# Patient Record
Sex: Male | Born: 1965 | Race: White | Hispanic: No | Marital: Married | Smoking: Never smoker
Health system: Southern US, Community
[De-identification: ages and names within clinical notes are randomized; demographics above are authoritative.]

## PROBLEM LIST (undated history)

## (undated) DIAGNOSIS — K648 Other hemorrhoids: Secondary | ICD-10-CM

## (undated) DIAGNOSIS — K824 Cholesterolosis of gallbladder: Secondary | ICD-10-CM

## (undated) DIAGNOSIS — K219 Gastro-esophageal reflux disease without esophagitis: Secondary | ICD-10-CM

## (undated) DIAGNOSIS — R030 Elevated blood-pressure reading, without diagnosis of hypertension: Secondary | ICD-10-CM

## (undated) DIAGNOSIS — B880 Other acariasis: Secondary | ICD-10-CM

## (undated) DIAGNOSIS — I499 Cardiac arrhythmia, unspecified: Secondary | ICD-10-CM

## (undated) DIAGNOSIS — F419 Anxiety disorder, unspecified: Secondary | ICD-10-CM

## (undated) DIAGNOSIS — I861 Scrotal varices: Secondary | ICD-10-CM

## (undated) DIAGNOSIS — N433 Hydrocele, unspecified: Secondary | ICD-10-CM

## (undated) DIAGNOSIS — Z8489 Family history of other specified conditions: Secondary | ICD-10-CM

## (undated) DIAGNOSIS — I6529 Occlusion and stenosis of unspecified carotid artery: Secondary | ICD-10-CM

## (undated) DIAGNOSIS — E7800 Pure hypercholesterolemia, unspecified: Secondary | ICD-10-CM

## (undated) HISTORY — DX: Gastro-esophageal reflux disease without esophagitis: K21.9

## (undated) HISTORY — DX: Pure hypercholesterolemia, unspecified: E78.00

## (undated) HISTORY — PX: COLONOSCOPY: SHX174

## (undated) HISTORY — PX: EXPLORATORY LAPAROTOMY: SUR591

---

## 2007-09-07 ENCOUNTER — Ambulatory Visit (HOSPITAL_COMMUNITY): Admission: RE | Admit: 2007-09-07 | Discharge: 2007-09-07 | Payer: Self-pay | Admitting: Family Medicine

## 2007-11-02 ENCOUNTER — Encounter (INDEPENDENT_AMBULATORY_CARE_PROVIDER_SITE_OTHER): Payer: Self-pay | Admitting: General Surgery

## 2007-11-02 ENCOUNTER — Ambulatory Visit (HOSPITAL_COMMUNITY): Admission: RE | Admit: 2007-11-02 | Discharge: 2007-11-02 | Payer: Self-pay | Admitting: General Surgery

## 2010-09-26 ENCOUNTER — Ambulatory Visit: Payer: Self-pay | Admitting: Cardiology

## 2011-05-06 NOTE — H&P (Signed)
[name], [name]                 ACCOUNT NO.:  [id number]   MEDICAL RECORD NO.:  [id number]          PATIENT TYPE:  AMB   LOCATION:  DAY                           FACILITY:  [facility]   PHYSICIAN:  Dalia Heading, M.D.  DATE OF BIRTH:  1966-11-11   DATE OF ADMISSION:  DATE OF DISCHARGE:  LH                              HISTORY & PHYSICAL   CHIEF COMPLAINT:  Family history of colon carcinoma.   HISTORY OF PRESENT ILLNESS:  The patient is a 45 year old white male who  is referred for endoscopic evaluation.  He needs a colonoscopy for a  family history of colon carcinoma in his mother.  His mother was  diagnosed in her 25s with colon carcinoma.  No abdominal pain, weight  loss, nausea, vomiting, diarrhea, constipation, melena, or hematochezia  have been noted.  He has never had a colonoscopy.   PAST MEDICAL HISTORY:  Unremarkable.   PAST SURGICAL HISTORY:  Unremarkable.   CURRENT MEDICATIONS:  None.   ALLERGIES:  No known drug allergies.   REVIEW OF SYSTEMS:  Noncontributory.   PHYSICAL EXAMINATION:  GENERAL:  The patient is a well-developed, well-  nourished, white male in no acute distress.  LUNGS:  Clear to auscultation with equal breath sounds bilaterally.  HEART:  Reveals a regular rate and rhythm without S3, S4, or murmurs.  ABDOMEN:  Soft, nontender, nondistended.  No hepatosplenomegaly or  masses are noted.  RECTAL:  Deferred to the procedure.   IMPRESSION:  Family history of colon carcinoma.   PLAN:  The patient is scheduled for a colonoscopy on November 02, 2007.  The risks and benefits of the procedure including bleeding and  perforation were fully explained to the patient, gave informed consent.      Dalia Heading, M.D.  Electronically Signed     MAJ/MEDQ  D:  10/04/2007  T:  10/04/2007  Job:  [id number]   cc:   [facility]   [facility]  Fax: [phone]

## 2012-08-20 ENCOUNTER — Other Ambulatory Visit (HOSPITAL_COMMUNITY): Payer: Self-pay | Admitting: Family Medicine

## 2012-08-20 ENCOUNTER — Ambulatory Visit (HOSPITAL_COMMUNITY)
Admission: RE | Admit: 2012-08-20 | Discharge: 2012-08-20 | Disposition: A | Discharge status: Home / Self Care | Payer: BC Managed Care – PPO | Source: Ambulatory Visit | Attending: Family Medicine | Admitting: Family Medicine

## 2012-08-20 DIAGNOSIS — R079 Chest pain, unspecified: Secondary | ICD-10-CM | POA: Insufficient documentation

## 2012-09-01 HISTORY — PX: NM MYOVIEW LTD: HXRAD82

## 2012-12-23 ENCOUNTER — Other Ambulatory Visit (HOSPITAL_COMMUNITY): Payer: Self-pay | Admitting: Cardiovascular Disease

## 2012-12-23 DIAGNOSIS — R002 Palpitations: Secondary | ICD-10-CM

## 2012-12-27 ENCOUNTER — Encounter (INDEPENDENT_AMBULATORY_CARE_PROVIDER_SITE_OTHER): Payer: Self-pay | Admitting: *Deleted

## 2012-12-30 ENCOUNTER — Ambulatory Visit (HOSPITAL_COMMUNITY)
Admission: RE | Admit: 2012-12-30 | Discharge: 2012-12-30 | Disposition: A | Discharge status: Home / Self Care | Payer: BC Managed Care – PPO | Source: Ambulatory Visit | Attending: Cardiovascular Disease | Admitting: Cardiovascular Disease

## 2012-12-30 DIAGNOSIS — I059 Rheumatic mitral valve disease, unspecified: Secondary | ICD-10-CM | POA: Insufficient documentation

## 2012-12-30 DIAGNOSIS — R002 Palpitations: Secondary | ICD-10-CM | POA: Insufficient documentation

## 2012-12-30 DIAGNOSIS — I369 Nonrheumatic tricuspid valve disorder, unspecified: Secondary | ICD-10-CM | POA: Insufficient documentation

## 2012-12-30 NOTE — Progress Notes (Signed)
Alvin Howard   2D echo completed 12/30/2012.   Cindy Derrel Moore, RDCS   

## 2013-01-05 ENCOUNTER — Telehealth (INDEPENDENT_AMBULATORY_CARE_PROVIDER_SITE_OTHER): Payer: Self-pay | Admitting: *Deleted

## 2013-01-05 ENCOUNTER — Encounter (INDEPENDENT_AMBULATORY_CARE_PROVIDER_SITE_OTHER): Payer: Self-pay | Admitting: Internal Medicine

## 2013-01-05 ENCOUNTER — Other Ambulatory Visit (INDEPENDENT_AMBULATORY_CARE_PROVIDER_SITE_OTHER): Payer: Self-pay | Admitting: *Deleted

## 2013-01-05 ENCOUNTER — Ambulatory Visit (INDEPENDENT_AMBULATORY_CARE_PROVIDER_SITE_OTHER): Payer: BC Managed Care – PPO | Admitting: Internal Medicine

## 2013-01-05 VITALS — BP 118/62 | HR 72 | Temp 98.1°F | Ht 67.0 in | Wt 204.0 lb

## 2013-01-05 DIAGNOSIS — K219 Gastro-esophageal reflux disease without esophagitis: Secondary | ICD-10-CM | POA: Insufficient documentation

## 2013-01-05 DIAGNOSIS — Z800 Family history of malignant neoplasm of digestive organs: Secondary | ICD-10-CM | POA: Insufficient documentation

## 2013-01-05 DIAGNOSIS — Z1211 Encounter for screening for malignant neoplasm of colon: Secondary | ICD-10-CM

## 2013-01-05 DIAGNOSIS — E7800 Pure hypercholesterolemia, unspecified: Secondary | ICD-10-CM | POA: Insufficient documentation

## 2013-01-05 MED ORDER — PEG-KCL-NACL-NASULF-NA ASC-C 100 G PO SOLR: 1.0000 | Freq: Once | ORAL | Status: DC

## 2013-01-05 NOTE — Patient Instructions (Addendum)
EGD/Colonoscopy

## 2013-01-05 NOTE — Progress Notes (Signed)
Subjective:     Patient ID: Alvin Howard, male   DOB: 1966-09-04, 47 y.o.   MRN: [id number]  HPI  Referred to our office by Dr. Phillips Odor for heart burn/gas issues. Put on Prilosec and did not help. Symptoms started in September.  Chest xray, and EKG were normal in his office. He was then put on Nexium and sent to see Dr Tresa Endo . He had some anxiety, He had a nuclear [place] and echo were normal. Berkley test was abnormal.  He tells me he has frequent acid reflux. Sausage really upsets him. Appetite is good. Weight loss which was intentional due to his cholesterol. He avoids acidic fluids. Sometimes he has a funny feeling in his epigastric region  Presently has a Holter monitor on today.    12/16/2012 ALP 59, AST 26, ALT 39, Total protein 6.8, Albumin 4.6, H and H 15.1 and 43.5, MCV 81.0 TSH 0.824  11/02/2007 Colonoscopy: Dr. Lovell Sheehan There is adenomatous epithelium having a predominantly tubular growth pattern consistent with a tubular adenoma if the biopsy is representative of the entire lesion. No high grade dysplasia or evidence of malignancy is identified. Clinical correlation is recommended. (HCL:cdc 11/03/07)     Family hx of colon cancer: Mother in her late 49. Patient last colonoscopy was 6 yrs ago. Needs surveillance Review of Systems     Objective:   Physical Exam  Filed Vitals:   01/05/13 1053  BP: 118/62  Pulse: 72  Temp: 98.1 F (36.7 C)  Height: 5\' 7"  (1.702 m)  Weight: 204 lb (92.534 kg)  Alert and oriented. Skin warm and dry. Oral mucosa is moist.   . Sclera anicteric, conjunctivae is pink. Thyroid not enlarged. No cervical lymphadenopathy. Lungs clear. Heart regular rate and rhythm.  Abdomen is soft. Bowel sounds are positive. No hepatomegaly. No abdominal masses felt. No tenderness.  No edema to lower extremities. Patient is alert and oriented.       Assessment:    Possible GERD uncontrolled at this time.PUD needs to be ruled out.  Presently, cardiac work up  which has been negative so far. orgin being ruled out.   Family hx of colon cancer. Plan:     EGD/Colonoscopy with Dr. Karilyn Cota.

## 2013-01-05 NOTE — Telephone Encounter (Signed)
Patient needs movi prep 

## 2013-01-11 ENCOUNTER — Encounter (HOSPITAL_COMMUNITY): Payer: Self-pay | Admitting: Pharmacy Technician

## 2013-01-13 ENCOUNTER — Encounter (INDEPENDENT_AMBULATORY_CARE_PROVIDER_SITE_OTHER): Payer: Self-pay

## 2013-01-20 ENCOUNTER — Ambulatory Visit (HOSPITAL_COMMUNITY)
Admission: RE | Admit: 2013-01-20 | Discharge: 2013-01-20 | Disposition: A | Discharge status: Home / Self Care | Payer: BC Managed Care – PPO | Source: Ambulatory Visit | Attending: Internal Medicine | Admitting: Internal Medicine

## 2013-01-20 ENCOUNTER — Encounter (HOSPITAL_COMMUNITY)
Admission: RE | Disposition: A | Discharge status: Home / Self Care | Payer: Self-pay | Source: Ambulatory Visit | Attending: Internal Medicine

## 2013-01-20 ENCOUNTER — Encounter (HOSPITAL_COMMUNITY): Payer: Self-pay | Admitting: *Deleted

## 2013-01-20 ENCOUNTER — Other Ambulatory Visit (INDEPENDENT_AMBULATORY_CARE_PROVIDER_SITE_OTHER): Payer: Self-pay | Admitting: Internal Medicine

## 2013-01-20 DIAGNOSIS — K644 Residual hemorrhoidal skin tags: Secondary | ICD-10-CM | POA: Insufficient documentation

## 2013-01-20 DIAGNOSIS — Z860100 Personal history of colon polyps, unspecified: Secondary | ICD-10-CM | POA: Insufficient documentation

## 2013-01-20 DIAGNOSIS — R12 Heartburn: Secondary | ICD-10-CM

## 2013-01-20 DIAGNOSIS — K219 Gastro-esophageal reflux disease without esophagitis: Secondary | ICD-10-CM

## 2013-01-20 DIAGNOSIS — Z800 Family history of malignant neoplasm of digestive organs: Secondary | ICD-10-CM

## 2013-01-20 HISTORY — PX: COLONOSCOPY WITH ESOPHAGOGASTRODUODENOSCOPY (EGD): SHX5779

## 2013-01-20 SURGERY — COLONOSCOPY WITH ESOPHAGOGASTRODUODENOSCOPY (EGD)
Anesthesia: Moderate Sedation

## 2013-01-20 MED ORDER — MIDAZOLAM HCL 5 MG/5ML IJ SOLN
INTRAMUSCULAR | Status: AC
  Filled 2013-01-20: qty 10

## 2013-01-20 MED ORDER — MEPERIDINE HCL 50 MG/ML IJ SOLN
INTRAMUSCULAR | Status: DC | PRN
  Administered 2013-01-20 (×2): 25 mg via INTRAVENOUS

## 2013-01-20 MED ORDER — MEPERIDINE HCL 50 MG/ML IJ SOLN
INTRAMUSCULAR | Status: AC
  Filled 2013-01-20: qty 1

## 2013-01-20 MED ORDER — SODIUM CHLORIDE 0.45 % IV SOLN
INTRAVENOUS | Status: DC
  Administered 2013-01-20: 12:00:00 via INTRAVENOUS

## 2013-01-20 MED ORDER — MIDAZOLAM HCL 5 MG/5ML IJ SOLN
INTRAMUSCULAR | Status: DC | PRN
  Administered 2013-01-20 (×6): 2 mg via INTRAVENOUS

## 2013-01-20 MED ORDER — STERILE WATER FOR IRRIGATION IR SOLN
Status: DC | PRN
  Administered 2013-01-20: 13:00:00

## 2013-01-20 MED ORDER — MIDAZOLAM HCL 5 MG/5ML IJ SOLN
INTRAMUSCULAR | Status: AC
  Filled 2013-01-20: qty 5

## 2013-01-20 NOTE — H&P (Signed)
Alvin Howard is an 47 y.o. male.   Chief Complaint: Patient is here for EGD and colonoscopy. HPI: Patient is 46 year old Caucasian male who presents with 5 month history of heartburn and regurgitation and burning at epigastrium and has not responded to Prilosec and Nexium. He is watching his diet and doing better with Dexlansoprazole. result and appears to be doing better. He is on very strict diet. He denies dysphagia nausea vomiting melena or rectal bleeding. He has history of colonic adenoma and family history of colon carcinoma in his mother who was in her 70s at the time of diagnosis.  Past Medical History  Diagnosis Date  . High cholesterol   . GERD (gastroesophageal reflux disease)     Past Surgical History  Procedure Date  . Colonoscopy   . Exploratory laparotomy     as a child    Family History  Problem Relation Age of Onset  . Colon cancer Mother    Social History:  reports that he has never smoked. He does not have any smokeless tobacco history on file. He reports that he drinks alcohol. He reports that he does not use illicit drugs.  Allergies: No Known Allergies  Medications Prior to Admission  Medication Sig Dispense Refill  . alum & mag hydroxide-simeth (MAALOX/MYLANTA) 200-200-20 MG/5ML suspension Take 15 mLs by mouth every 6 (six) hours as needed. Upset Stomach      . LORazepam (ATIVAN) 0.5 MG tablet Take 0.5 mg by mouth 3 (three) times daily as needed. Anxiety      . peg 3350 powder (MOVIPREP) 100 G SOLR Take 1 kit (100 g total) by mouth once.  1 kit  0  . aspirin 81 MG tablet Take 81 mg by mouth daily.      Marland Kitchen co-enzyme Q-10 30 MG capsule Take 30 mg by mouth daily.      Marland Kitchen dexlansoprazole (DEXILANT) 60 MG capsule Take 60 mg by mouth daily.      . fish oil-omega-3 fatty acids 1000 MG capsule Take 2 g by mouth daily.      . Garlic 10 MG CAPS Take 1 capsule by mouth daily.       . Multiple Vitamin (MULTIVITAMIN) tablet Take 1 tablet by mouth daily.      . Red Yeast  Rice Extract (RED YEAST RICE PO) Take 2 tablets by mouth.      . simvastatin (ZOCOR) 20 MG tablet Take 20 mg by mouth daily. Takes every 3rd day.        No results found for this or any previous visit (from the past 48 hour(s)). No results found.  ROS  Blood pressure 132/84, pulse 73, temperature 98 F (36.7 C), temperature source Oral, resp. rate 22, SpO2 98.00%. Physical Exam  Constitutional: He appears well-developed and well-nourished.  HENT:  Mouth/Throat: Oropharynx is clear and moist.  Eyes: Conjunctivae normal are normal. No scleral icterus.  Neck: No thyromegaly present.  Cardiovascular: Normal rate, regular rhythm and normal heart sounds.   No murmur heard. Respiratory: Effort normal and breath sounds normal.  GI: Soft. He exhibits no distension and no mass. There is no tenderness.  Musculoskeletal: He exhibits no edema.  Lymphadenopathy:    He has no cervical adenopathy.  Neurological: He is alert.  Skin: Skin is warm and dry.     Assessment/Plan Refractory GERD of recent onset. History of colonic adenoma and family history of colon carcinoma in first degree. Diagnostic EGD and surveillance colonoscopy.  Alvin Howard U 01/20/2013,  1:09 PM

## 2013-01-20 NOTE — Op Note (Signed)
EGD PROCEDURE REPORT  PATIENT:  Alvin Howard  MR#:  [id number] Birthdate:  12-23-1965, 47 y.o., male Endoscopist:  Dr. Malissa Hippo, MD Referred By:  Dr. Colette Ribas, MD Procedure Date: 01/20/2013  Procedure:   EGD & Colonoscopy  Indications:  Patient is 47 year old Caucasian male with recent onset of symptoms of GERD not responding to two PPIs but he is doing better with dexlansoprazole. He is undergoing diagnostic EGD followed by surveillance colonoscopy. He has small adenoma removed on his last colonoscopy over 5 years ago and family history significant for colon carcinoma in his mother(in her 79s at the time of diagnosis).           Informed Consent:  The risks, benefits, alternatives & imponderables which include, but are not limited to, bleeding, infection, perforation, drug reaction and potential missed lesion have been reviewed.  The potential for biopsy, lesion removal, esophageal dilation, etc. have also been discussed.  Questions have been answered.  All parties agreeable.  Please see history & physical in medical record for more information.  Medications:  Demerol 50 mg IV Versed 12 mg IV Cetacaine spray topically for oropharyngeal anesthesia  EGD  Description of procedure:  The endoscope was introduced through the mouth and advanced to the second portion of the duodenum without difficulty or limitations. The mucosal surfaces were surveyed very carefully during advancement of the scope and upon withdrawal.  Findings:  Esophagus:  Mucosa of the esophagus was normal. GE junction was unremarkable. GEJ:  39 cm Stomach:  Stomach was empty and distended very well with insufflation. Folds in the proximal stomach were normal. Examination of mucosa at body, antrum, pyloric channel, angularis fundus and cardia was normal. Duodenum:  Normal bulbar and post bulbar mucosa.  Therapeutic/Diagnostic Maneuvers Performed:  None  COLONOSCOPY Description of procedure:  After a  digital rectal exam was performed, that colonoscope was advanced from the anus through the rectum and colon to the area of the cecum, ileocecal valve and appendiceal orifice. The cecum was deeply intubated. These structures were well-seen and photographed for the record. From the level of the cecum and ileocecal valve, the scope was slowly and cautiously withdrawn. The mucosal surfaces were carefully surveyed utilizing scope tip to flexion to facilitate fold flattening as needed. The scope was pulled down into the rectum where a thorough exam including retroflexion was performed.  Findings:   Prep excellent. Normal mucosa of area segments of colon and rectum. Small hemorrhoids noted below the dentate line.  Therapeutic/Diagnostic Maneuvers Performed:  None  Complications:  None  Cecal Withdrawal Time:  10 minutes  Impression:  Normal esophagogastroduodenoscopy. Small external hemorrhoids otherwise normal colonoscopy.  Recommendations:  Patient will continue anti-reflux measures and  dexlansoprazole at current dose. Will schedule upper abdominal ultrasound to rule out cholelithiasis. Next colonoscopy would be in 5 years.  Landis Cassaro U  01/20/2013 2:02 PM  CC: Dr. Phillips Odor, Chancy Hurter, MD & Dr. Bonnetta Barry ref. provider found

## 2013-01-21 ENCOUNTER — Encounter (HOSPITAL_COMMUNITY): Payer: Self-pay | Admitting: Internal Medicine

## 2013-01-25 ENCOUNTER — Ambulatory Visit (HOSPITAL_COMMUNITY)
Admission: RE | Admit: 2013-01-25 | Discharge: 2013-01-25 | Disposition: A | Discharge status: Home / Self Care | Payer: BC Managed Care – PPO | Source: Ambulatory Visit | Attending: Internal Medicine | Admitting: Internal Medicine

## 2013-01-25 DIAGNOSIS — R12 Heartburn: Secondary | ICD-10-CM

## 2013-01-25 DIAGNOSIS — K824 Cholesterolosis of gallbladder: Secondary | ICD-10-CM | POA: Insufficient documentation

## 2013-01-25 DIAGNOSIS — K219 Gastro-esophageal reflux disease without esophagitis: Secondary | ICD-10-CM

## 2013-01-25 HISTORY — DX: Cholesterolosis of gallbladder: K82.4

## 2013-02-04 ENCOUNTER — Telehealth (INDEPENDENT_AMBULATORY_CARE_PROVIDER_SITE_OTHER): Payer: Self-pay | Admitting: General Surgery

## 2013-02-04 NOTE — Telephone Encounter (Signed)
Called and left message with patient's wife Alvin Howard) to advise there was a cancellation in the schedule for a 9:00 appointment. Advised if the patient was willing to be seen sooner to arrive at 8:30 for a 9:00 appointment. I advised I would keep the appointment at 11:45 in the system as it is and that if the patient can confirm he can come earlier that the appointment can be changed at that time.   I advised Alvin Howard that our office opens at 8:00, phones at 8:30, and if they want to come directly to our office that is fine. She stated that he does have an appointment with his regular doctor at 8:15 so she is not sure if he is willing to skip that appointment to come see [place] earlier.

## 2013-02-05 ENCOUNTER — Other Ambulatory Visit: Payer: Self-pay

## 2013-02-07 ENCOUNTER — Encounter (INDEPENDENT_AMBULATORY_CARE_PROVIDER_SITE_OTHER): Payer: Self-pay | Admitting: General Surgery

## 2013-02-07 ENCOUNTER — Ambulatory Visit (INDEPENDENT_AMBULATORY_CARE_PROVIDER_SITE_OTHER): Payer: BC Managed Care – PPO | Admitting: General Surgery

## 2013-02-07 VITALS — BP 132/86 | HR 68 | Temp 97.8°F | Resp 16 | Ht 67.0 in | Wt 195.0 lb

## 2013-02-07 DIAGNOSIS — K824 Cholesterolosis of gallbladder: Secondary | ICD-10-CM

## 2013-02-07 NOTE — Patient Instructions (Signed)
Your symptoms are consistent with recurrent gallbladder attacks. Your ultrasound shows multiple filling defects in the gallbladder consistent with polyps or possibly stones.  Considering the extensive GI and cardiac workup that you have had, I recommended that the next step in your treatment is cholecystectomy. Your symptoms will probably improve or resolve, although we cannot guarantee that with certainty.  I recommend that you go ahead and start the beta blockers that have been prescribed for you.  Please stop your aspirin 3 days prior to surgery.       Laparoscopic Cholecystectomy Laparoscopic cholecystectomy is surgery to remove the gallbladder. The gallbladder is located slightly to the right of center in the abdomen, behind the liver. It is a concentrating and storage sac for the bile produced in the liver. Bile aids in the digestion and absorption of fats. Gallbladder disease (cholecystitis) is an inflammation of your gallbladder. This condition is usually caused by a buildup of gallstones (cholelithiasis) in your gallbladder. Gallstones can block the flow of bile, resulting in inflammation and pain. In severe cases, emergency surgery may be required. When emergency surgery is not required, you will have time to prepare for the procedure. Laparoscopic surgery is an alternative to open surgery. Laparoscopic surgery usually has a shorter recovery time. Your common bile duct may also need to be examined and explored. Your caregiver will discuss this with you if he or she feels this should be done. If stones are found in the common bile duct, they may be removed. LET YOUR CAREGIVER KNOW ABOUT:  Allergies to food or medicine.  Medicines taken, including vitamins, herbs, eyedrops, over-the-counter medicines, and creams.  Use of steroids (by mouth or creams).  Previous problems with anesthetics or numbing medicines.  History of bleeding problems or blood clots.  Previous surgery.  Other  health problems, including diabetes and kidney problems.  Possibility of pregnancy, if this applies. RISKS AND COMPLICATIONS All surgery is associated with risks. Some problems that may occur following this procedure include:  Infection.  Damage to the common bile duct, nerves, arteries, veins, or other internal organs such as the stomach or intestines.  Bleeding.  A stone may remain in the common bile duct. BEFORE THE PROCEDURE  Do not take aspirin for 3 days prior to surgery or blood thinners for 1 week prior to surgery.  Do not eat or drink anything after midnight the night before surgery.  Let your caregiver know if you develop a cold or other infectious problem prior to surgery.  You should be present 60 minutes before the procedure or as directed. PROCEDURE  You will be given medicine that makes you sleep (general anesthetic). When you are asleep, your surgeon will make several small cuts (incisions) in your abdomen. One of these incisions is used to insert a small, lighted scope (laparoscope) into the abdomen. The laparoscope helps the surgeon see into your abdomen. Carbon dioxide gas will be pumped into your abdomen. The gas allows more room for the surgeon to perform your surgery. Other operating instruments are inserted through the other incisions. Laparoscopic procedures may not be appropriate when:  There is major scarring from previous surgery.  The gallbladder is extremely inflamed.  There are bleeding disorders or unexpected cirrhosis of the liver.  A pregnancy is near term.  Other conditions make the laparoscopic procedure impossible. If your surgeon feels it is not safe to continue with a laparoscopic procedure, he or she will perform an open abdominal procedure. In this case, the surgeon will  make an incision to open the abdomen. This gives the surgeon a larger view and field to work within. This may allow the surgeon to perform procedures that sometimes cannot be  performed with a laparoscope alone. Open surgery has a longer recovery time. AFTER THE PROCEDURE  You will be taken to the recovery area where a nurse will watch and check your progress.  You may be allowed to go home the same day.  Do not resume physical activities until directed by your caregiver.  You may resume a normal diet and activities as directed. Document Released: 12/08/2005 Document Revised: 03/01/2012 Document Reviewed: 05/23/2011 [facility] Patient Information 2013 [place], [place].

## 2013-02-07 NOTE — Progress Notes (Signed)
Patient ID: Alvin Howard, male   DOB: 02-12-1966, 47 y.o.   MRN: [id number]  Chief Complaint  Patient presents with  . Other    new pt- eval gb polyps    HPI Alvin Howard is a 47 y.o. male.  He is referred by Dr. Karilyn Cota and Dr. Assunta Found for consideration of cholecystectomy.  This patient has had symptoms since August of 2013. He has had non-exertional epigastric and substernal chest pressure. This is often, but not always after a heavy meal. He denies nausea or vomiting. This was thought to be reflux and he was placed on proton pump inhibitors. He had an extensive workup by Dr. Norwood Levo including echocardiogram, stress test and Holter monitor and was told that there was nothing abnormal except for occasional tachycardia for which he has been prescribed beta blockers. His last significant episode of pain was over Christmas when he ate at the [facility]. . Since that time he has had some minor episodes but nothing bad. He has a stringently avoid any fatty foods, salad dressings, red meats and has just been eating chicken and fish. He has lost about 14 pounds. Postprandial symptoms seem to be the most common pattern.  Upper endoscopy and colonoscopy were normal on 01/05/2013. Liver function tests and white count were normal on 12/16/2012. H. Pylori negative.  Ultrasound shows an abnormal gallbladder with numerous polyps but no stones that moved. Prominent wall but not grossly thickened. CBD not dilated.  Past history is significant in significant for what looks like a laparoscopy at age 51 following blunt trauma. Performed and eaten. No intra-abdominal injuries occurred to the patient.  Small puncture wound the umbilicus is the only scar. Colonoscopy 2000 Richard ascending colon adenomas. He has hyperlipidemia.  Family history significant for colon cancer in the mother, cardiac disease, lung cancer, and gallbladder disease in the father. Mother also had ovarian cancer.  The patient is  asymptomatic today and  is here with his wife and daughter. HPI  Past Medical History  Diagnosis Date  . High cholesterol   . GERD (gastroesophageal reflux disease)     Past Surgical History  Procedure Laterality Date  . Colonoscopy    . Exploratory laparotomy      as a child  . Colonoscopy with esophagogastroduodenoscopy (egd)  01/20/2013    Procedure: COLONOSCOPY WITH ESOPHAGOGASTRODUODENOSCOPY (EGD);  Surgeon: Malissa Hippo, MD;  Location: [facility];  Service: Endoscopy;  Laterality: N/A;  100    Family History  Problem Relation Age of Onset  . Colon cancer Mother   . Cancer Mother     colon, ovarian  . Cancer Father     lung    Social History History  Substance Use Topics  . Smoking status: Never Smoker   . Smokeless tobacco: Not on file  . Alcohol Use: Yes     Comment: occasionally red wine    No Known Allergies  Current Outpatient Prescriptions  Medication Sig Dispense Refill  . alum & mag hydroxide-simeth (MAALOX/MYLANTA) 200-200-20 MG/5ML suspension Take 15 mLs by mouth every 6 (six) hours as needed. Upset Stomach      . aspirin 81 MG tablet Take 81 mg by mouth daily.      Marland Kitchen co-enzyme Q-10 30 MG capsule Take 30 mg by mouth daily.      Marland Kitchen dexlansoprazole (DEXILANT) 60 MG capsule Take 60 mg by mouth daily.      . fish oil-omega-3 fatty acids 1000 MG capsule Take  2 g by mouth daily.      . Garlic 10 MG CAPS Take 1 capsule by mouth daily.       Marland Kitchen LORazepam (ATIVAN) 0.5 MG tablet Take 0.5 mg by mouth 3 (three) times daily as needed. Anxiety      . Multiple Vitamin (MULTIVITAMIN) tablet Take 1 tablet by mouth daily.      . Red Yeast Rice Extract (RED YEAST RICE PO) Take 2 tablets by mouth.      . simvastatin (ZOCOR) 20 MG tablet Take 20 mg by mouth daily. Takes every 3rd day.       No current facility-administered medications for this visit.    Review of Systems Review of Systems  Constitutional: Negative for fever, chills and unexpected weight change.   HENT: Negative for hearing loss, congestion, sore throat, trouble swallowing and voice change.   Eyes: Negative for visual disturbance.  Respiratory: Negative for cough and wheezing.   Cardiovascular: Negative for chest pain, palpitations and leg swelling.  Gastrointestinal: Positive for abdominal pain. Negative for nausea, vomiting, diarrhea, constipation, blood in stool, abdominal distention, anal bleeding and rectal pain.  Genitourinary: Negative for hematuria and difficulty urinating.  Musculoskeletal: Negative for arthralgias.  Skin: Negative for rash and wound.  Neurological: Negative for seizures, syncope, weakness and headaches.  Hematological: Negative for adenopathy. Does not bruise/bleed easily.  Psychiatric/Behavioral: Negative for confusion. The patient is nervous/anxious.     Blood pressure 132/86, pulse 68, temperature 97.8 F (36.6 C), temperature source Temporal, resp. rate 16, height 5\' 7"  (1.702 m), weight 195 lb (88.451 kg).  Physical Exam Physical Exam  Constitutional: He is oriented to person, place, and time. He appears well-developed and well-nourished. No distress.  HENT:  Head: Normocephalic.  Nose: Nose normal.  Mouth/Throat: No oropharyngeal exudate.  Eyes: Conjunctivae and EOM are normal. Pupils are equal, round, and reactive to light. Right eye exhibits no discharge. Left eye exhibits no discharge. No scleral icterus.  Neck: Normal range of motion. Neck supple. No JVD present. No tracheal deviation present. No thyromegaly present.  Cardiovascular: Normal rate, regular rhythm, normal heart sounds and intact distal pulses.   No murmur heard. Pulmonary/Chest: Effort normal and breath sounds normal. No stridor. No respiratory distress. He has no wheezes. He has no rales. He exhibits no tenderness.  Abdominal: Soft. Bowel sounds are normal. He exhibits no distension and no mass. There is no tenderness. There is no rebound and no guarding.  Small puncture wound  in the midline just below the umbilicus, looks like a laparoscopy scar. No mass. No organomegaly. No hernia.  Musculoskeletal: Normal range of motion. He exhibits no edema and no tenderness.  Lymphadenopathy:    He has no cervical adenopathy.  Neurological: He is alert and oriented to person, place, and time. He has normal reflexes. Coordination normal.  Skin: Skin is warm and dry. No rash noted. He is not diaphoretic. No erythema. No pallor.  Psychiatric: He has a normal mood and affect. His behavior is normal. Judgment and thought content normal.    Data Reviewed Lab work, office notes, endoscopy and colonoscopy report, cardiac workup.  Assessment    Gallbladder polyps, probable chronic cholecystitis and recurrent biliary colic.  The probability of his symptoms resolving following cholecystectomy is relatively high, possibly in the 75-85% range given the constellation of symptoms, findings, family history ,and workup to date.   I have discussed these issues with him in detail and have offered him cholecystectomy, and that it that is  what he would like to do.  Hyperlipidemia  Superimposed anxiety, this has improved since his cardiac workup and GI workup were otherwise negative.  History exploratory laparoscopy H. Kim following blunt trauma Probable superimposed GERD, although no esophagitis on recent endoscopy.     Plan    Proceed with scheduling of laparoscopic cholecystectomy with cholangiogram, possible open.  Discontinue aspirin 2-3 days preop  I told him to stay on his beta blocker   I discussed the indications, details, techniques, and numerous risks of the surgery with the patient and his wife. I reviewed patient information booklets with diagrams. They understand all these issues. All their questions are answered. They agree with this plan.       Alvin Howard. Derrell Lolling, M.D., [facility] General and Minimally invasive Surgery Breast and  Colorectal Surgery Office:   [phone] Pager:   [phone]  02/07/2013, 12:40 PM

## 2013-02-10 ENCOUNTER — Encounter (HOSPITAL_COMMUNITY)
Admission: RE | Admit: 2013-02-10 | Discharge: 2013-02-10 | Disposition: A | Discharge status: Home / Self Care | Payer: BC Managed Care – PPO | Source: Ambulatory Visit | Attending: General Surgery | Admitting: General Surgery

## 2013-02-10 ENCOUNTER — Encounter (HOSPITAL_COMMUNITY): Payer: Self-pay

## 2013-02-10 ENCOUNTER — Encounter (HOSPITAL_COMMUNITY): Payer: Self-pay | Admitting: Pharmacy Technician

## 2013-02-10 HISTORY — DX: Family history of other specified conditions: Z84.89

## 2013-02-10 HISTORY — DX: Cardiac arrhythmia, unspecified: I49.9

## 2013-02-10 HISTORY — DX: Other hemorrhoids: K64.8

## 2013-02-10 HISTORY — DX: Anxiety disorder, unspecified: F41.9

## 2013-02-10 LAB — SURGICAL PCR SCREEN
MRSA, PCR: NEGATIVE
Staphylococcus aureus: POSITIVE — AB

## 2013-02-10 LAB — COMPREHENSIVE METABOLIC PANEL
ALT: 16 U/L (ref 0–53)
AST: 16 U/L (ref 0–37)
CO2: 29 mEq/L (ref 19–32)
Calcium: 9.5 mg/dL (ref 8.4–10.5)
GFR calc non Af Amer: >90 mL/min (ref >90)
Sodium: 141 mEq/L (ref 135–145)

## 2013-02-10 LAB — CBC WITH DIFFERENTIAL/PLATELET
Basophils Absolute: 0 10*3/uL (ref 0.0–0.1)
Eosinophils Relative: 1 % (ref 0–5)
Lymphocytes Relative: 37 % (ref 12–46)
MCV: 82.5 fL (ref 78.0–100.0)
Neutro Abs: 2.5 10*3/uL (ref 1.7–7.7)
Platelets: 242 10*3/uL (ref 150–400)
RDW: 12.9 % (ref 11.5–15.5)
WBC: 4.8 10*3/uL (ref 4.0–10.5)

## 2013-02-10 NOTE — Pre-Procedure Instructions (Addendum)
Alvin Howard  02/10/2013   Your procedure is scheduled on: Monday, February 24th.  Report to [facility] at 5:30AM.  Call this number if you have problems the morning of surgery: [phone]   Remember:   Do not eat food or drink liquids after midnight.    Take these medicines the morning of surgery with A SIP OF WATER: Dexilant, Escitalopram (Lexapro), Metoprolol. May take Lorazepam (Ativan) if needed.   Stop taking Aspirin, Coumadin, Plavix, Effient and herbal medications.  Do not take any NSAIDS ie:  Ibuprofen, Advil, Naproxen.   Do not wear jewelry, make-up or nail polish.  Do not wear lotions, powders, or perfumes. You may wear deodorant.             Men may shave face and neck.  Do not bring valuables to the hospital.  Contacts, dentures or bridgework may not be worn into surgery.  Leave suitcase in the car. After surgery it may be brought to your room.  For patients admitted to the hospital, checkout time is 11:00 AM the day of  discharge.   Patients discharged the day of surgery will not be allowed to drive  home.  Name and phone number of your driver:-   Special Instructions: Shower using CHG 2 nights before surgery and the night before surgery.  If you shower the day of surgery use CHG.  Use special wash - you have one bottle of CHG for all showers.  You should use approximately 1/3 of the bottle for each shower.   Please read over the following fact sheets that you were given: Pain Booklet, Coughing and Deep Breathing and Surgical Site Infection Prevention

## 2013-02-13 MED ORDER — CEFAZOLIN SODIUM-DEXTROSE 2-3 GM-% IV SOLR
2.0000 g | INTRAVENOUS | Status: AC
  Administered 2013-02-14: 2 g via INTRAVENOUS
  Filled 2013-02-13: qty 50

## 2013-02-13 NOTE — H&P (Signed)
[name]   MRN:  [id number]   Description: 47 year old male  Provider: Ernestene Mention, MD  Department: [facility]        Current Vitals -   BP Pulse Temp(Src) Resp Ht Wt    132/86 68 97.8 F (36.6 C) (Temporal) 16 5\' 7"  (1.702 m) 195 lb (88.451 kg)    BMI -30.53 kg/m2                History and Physical    Ernestene Mention, MD    Status: Signed                           HPI Alvin Howard is a 47 y.o. male.  He is referred by Dr. Karilyn Cota and Dr. Assunta Found for consideration of cholecystectomy.   This patient has had symptoms since August of 2013. He has had non-exertional epigastric and substernal chest pressure. This is often, but not always after a heavy meal. He denies nausea or vomiting. This was thought to be reflux and he was placed on proton pump inhibitors. He had an extensive workup by Dr. Norwood Levo including echocardiogram, stress test and Holter monitor and was told that there was nothing abnormal except for occasional tachycardia for which he has been prescribed beta blockers. His last significant episode of pain was over Christmas when he ate at the [facility]. . Since that time he has had some minor episodes but nothing bad. He has a stringently avoid any fatty foods, salad dressings, red meats and has just been eating chicken and fish. He has lost about 14 pounds. Postprandial symptoms seem to be the most common pattern.   Upper endoscopy and colonoscopy were normal on 01/05/2013. Liver function tests and white count were normal on 12/16/2012. H. Pylori negative.   Ultrasound shows an abnormal gallbladder with numerous polyps but no stones that moved. Prominent wall but not grossly thickened. CBD not dilated.   Past history is significant in significant for what looks like a laparoscopy at age 87 following blunt trauma. Performed and eaten. No intra-abdominal injuries occurred to the patient.  Small puncture wound the umbilicus is the only  scar. Colonoscopy 2000 Richard ascending colon adenomas. He has hyperlipidemia.   Family history significant for colon cancer in the mother, cardiac disease, lung cancer, and gallbladder disease in the father. Mother also had ovarian cancer.   The patient is asymptomatic today and  is here with his wife and daughter.         Past Medical History   Diagnosis  Date   .  High cholesterol     .  GERD (gastroesophageal reflux disease)           Past Surgical History   Procedure  Laterality  Date   .  Colonoscopy       .  Exploratory laparotomy           as a child   .  Colonoscopy with esophagogastroduodenoscopy (egd)    01/20/2013       Procedure: COLONOSCOPY WITH ESOPHAGOGASTRODUODENOSCOPY (EGD);  Surgeon: Malissa Hippo, MD;  Location: [facility];  Service: Endoscopy;  Laterality: N/A;  100         Family History   Problem  Relation  Age of Onset   .  Colon cancer  Mother     .  Cancer  Mother  colon, ovarian   .  Cancer  Father         lung        Social History History   Substance Use Topics   .  Smoking status:  Never Smoker    .  Smokeless tobacco:  Not on file   .  Alcohol Use:  Yes         Comment: occasionally red wine        No Known Allergies    Current Outpatient Prescriptions   Medication  Sig  Dispense  Refill   .  alum & mag hydroxide-simeth (MAALOX/MYLANTA) 200-200-20 MG/5ML suspension  Take 15 mLs by mouth every 6 (six) hours as needed. Upset Stomach         .  aspirin 81 MG tablet  Take 81 mg by mouth daily.         Marland Kitchen  co-enzyme Q-10 30 MG capsule  Take 30 mg by mouth daily.         Marland Kitchen  dexlansoprazole (DEXILANT) 60 MG capsule  Take 60 mg by mouth daily.         .  fish oil-omega-3 fatty acids 1000 MG capsule  Take 2 g by mouth daily.         .  Garlic 10 MG CAPS  Take 1 capsule by mouth daily.          Marland Kitchen  LORazepam (ATIVAN) 0.5 MG tablet  Take 0.5 mg by mouth 3 (three) times daily as needed. Anxiety         .  Multiple Vitamin  (MULTIVITAMIN) tablet  Take 1 tablet by mouth daily.         .  Red Yeast Rice Extract (RED YEAST RICE PO)  Take 2 tablets by mouth.         .  simvastatin (ZOCOR) 20 MG tablet  Take 20 mg by mouth daily. Takes every 3rd day.             No current facility-administered medications for this visit.        Review of Systems  Constitutional: Negative for fever, chills and unexpected weight change.  HENT: Negative for hearing loss, congestion, sore throat, trouble swallowing and voice change.   Eyes: Negative for visual disturbance.  Respiratory: Negative for cough and wheezing.   Cardiovascular: Negative for chest pain, palpitations and leg swelling.  Gastrointestinal: Positive for abdominal pain. Negative for nausea, vomiting, diarrhea, constipation, blood in stool, abdominal distention, anal bleeding and rectal pain.  Genitourinary: Negative for hematuria and difficulty urinating.  Musculoskeletal: Negative for arthralgias.  Skin: Negative for rash and wound.  Neurological: Negative for seizures, syncope, weakness and headaches.  Hematological: Negative for adenopathy. Does not bruise/bleed easily.  Psychiatric/Behavioral: Negative for confusion. The patient is nervous/anxious.       Blood pressure 132/86, pulse 68, temperature 97.8 F (36.6 C), temperature source Temporal, resp. rate 16, height 5\' 7"  (1.702 m), weight 195 lb (88.451 kg).   Physical Exam   Constitutional: He is oriented to person, place, and time. He appears well-developed and well-nourished. No distress.  HENT:   Head: Normocephalic.   Nose: Nose normal.   Mouth/Throat: No oropharyngeal exudate.  Eyes: Conjunctivae and EOM are normal. Pupils are equal, round, and reactive to light. Right eye exhibits no discharge. Left eye exhibits no discharge. No scleral icterus.  Neck: Normal range of motion. Neck supple. No JVD present. No tracheal deviation present. No thyromegaly present.  Cardiovascular: Normal rate,  regular rhythm, normal heart sounds and intact distal pulses.    No murmur heard. Pulmonary/Chest: Effort normal and breath sounds normal. No stridor. No respiratory distress. He has no wheezes. He has no rales. He exhibits no tenderness.  Abdominal: Soft. Bowel sounds are normal. He exhibits no distension and no mass. There is no tenderness. There is no rebound and no guarding.  Small puncture wound in the midline just below the umbilicus, looks like a laparoscopy scar. No mass. No organomegaly. No hernia.  Musculoskeletal: Normal range of motion. He exhibits no edema and no tenderness.  Lymphadenopathy:    He has no cervical adenopathy.  Neurological: He is alert and oriented to person, place, and time. He has normal reflexes. Coordination normal.  Skin: Skin is warm and dry. No rash noted. He is not diaphoretic. No erythema. No pallor.  Psychiatric: He has a normal mood and affect. His behavior is normal. Judgment and thought content normal.      Data Reviewed Lab work, office notes, endoscopy and colonoscopy report, cardiac workup.   Assessment     Gallbladder polyps, probable chronic cholecystitis and recurrent biliary colic.   The probability of his symptoms resolving following cholecystectomy is relatively high, possibly in the 75-85% range given the constellation of symptoms, findings, family history ,and workup to date.    I have discussed these issues with him in detail and have offered him cholecystectomy, and that it that is what he would like to do.   Hyperlipidemia   Superimposed anxiety, this has improved since his cardiac workup and GI workup were otherwise negative.   History exploratory laparoscopy H. Kim following blunt trauma Probable superimposed GERD, although no esophagitis on recent endoscopy.      Plan    Proceed with scheduling of laparoscopic cholecystectomy with cholangiogram, possible open.   Discontinue aspirin 2-3 days preop   I told him  to stay on his beta blocker    I discussed the indications, details, techniques, and numerous risks of the surgery with the patient and his wife. I reviewed patient information booklets with diagrams. They understand all these issues. All their questions are answered. They agree with this plan.          Angelia Mould. Derrell Lolling, M.D., [facility] General and Minimally invasive Surgery Breast and Colorectal Surgery Office:   [phone] Pager:   [phone]

## 2013-02-14 ENCOUNTER — Encounter (HOSPITAL_COMMUNITY)
Admission: RE | Disposition: A | Discharge status: Home / Self Care | Payer: Self-pay | Source: Ambulatory Visit | Attending: General Surgery

## 2013-02-14 ENCOUNTER — Ambulatory Visit (HOSPITAL_COMMUNITY)
Admission: RE | Admit: 2013-02-14 | Discharge: 2013-02-14 | Disposition: A | Discharge status: Home / Self Care | Payer: BC Managed Care – PPO | Source: Ambulatory Visit | Attending: General Surgery | Admitting: General Surgery

## 2013-02-14 ENCOUNTER — Ambulatory Visit (HOSPITAL_COMMUNITY): Payer: BC Managed Care – PPO | Admitting: Anesthesiology

## 2013-02-14 ENCOUNTER — Encounter (HOSPITAL_COMMUNITY): Payer: Self-pay

## 2013-02-14 ENCOUNTER — Ambulatory Visit (HOSPITAL_COMMUNITY): Payer: BC Managed Care – PPO

## 2013-02-14 ENCOUNTER — Encounter (HOSPITAL_COMMUNITY): Payer: Self-pay | Admitting: Anesthesiology

## 2013-02-14 DIAGNOSIS — K811 Chronic cholecystitis: Secondary | ICD-10-CM

## 2013-02-14 DIAGNOSIS — K824 Cholesterolosis of gallbladder: Secondary | ICD-10-CM | POA: Diagnosis present

## 2013-02-14 DIAGNOSIS — Z01812 Encounter for preprocedural laboratory examination: Secondary | ICD-10-CM | POA: Insufficient documentation

## 2013-02-14 HISTORY — PX: CHOLECYSTECTOMY: SHX55

## 2013-02-14 SURGERY — LAPAROSCOPIC CHOLECYSTECTOMY WITH INTRAOPERATIVE CHOLANGIOGRAM
Anesthesia: General | Wound class: Clean Contaminated

## 2013-02-14 MED ORDER — PROPOFOL 10 MG/ML IV BOLUS
INTRAVENOUS | Status: DC | PRN
  Administered 2013-02-14: 200 mg via INTRAVENOUS
  Administered 2013-02-14: 30 mg via INTRAVENOUS

## 2013-02-14 MED ORDER — HYDROMORPHONE HCL PF 1 MG/ML IJ SOLN
INTRAMUSCULAR | Status: AC
  Filled 2013-02-14: qty 1

## 2013-02-14 MED ORDER — GLYCOPYRROLATE 0.2 MG/ML IJ SOLN
INTRAMUSCULAR | Status: DC | PRN
  Administered 2013-02-14: 0.6 mg via INTRAVENOUS

## 2013-02-14 MED ORDER — HYDROMORPHONE HCL PF 1 MG/ML IJ SOLN
0.2500 mg | INTRAMUSCULAR | Status: DC | PRN
  Administered 2013-02-14 (×4): 0.5 mg via INTRAVENOUS

## 2013-02-14 MED ORDER — EPHEDRINE SULFATE 50 MG/ML IJ SOLN
INTRAMUSCULAR | Status: DC | PRN
  Administered 2013-02-14: 10 mg via INTRAVENOUS

## 2013-02-14 MED ORDER — MORPHINE SULFATE 2 MG/ML IJ SOLN: 2.0000 mg | INTRAMUSCULAR | Status: DC | PRN

## 2013-02-14 MED ORDER — LIDOCAINE HCL (CARDIAC) 20 MG/ML IV SOLN
INTRAVENOUS | Status: DC | PRN
  Administered 2013-02-14: 90 mg via INTRAVENOUS

## 2013-02-14 MED ORDER — LIDOCAINE HCL 4 % MT SOLN
OROMUCOSAL | Status: DC | PRN
  Administered 2013-02-14: 4 mL via TOPICAL

## 2013-02-14 MED ORDER — FENTANYL CITRATE 0.05 MG/ML IJ SOLN
INTRAMUSCULAR | Status: DC | PRN
  Administered 2013-02-14: 50 ug via INTRAVENOUS
  Administered 2013-02-14: 75 ug via INTRAVENOUS

## 2013-02-14 MED ORDER — OXYCODONE HCL 5 MG PO TABS: 5.0000 mg | ORAL_TABLET | ORAL | Status: DC | PRN

## 2013-02-14 MED ORDER — ARTIFICIAL TEARS OP OINT
TOPICAL_OINTMENT | OPHTHALMIC | Status: DC | PRN
  Administered 2013-02-14: 1 via OPHTHALMIC

## 2013-02-14 MED ORDER — ROCURONIUM BROMIDE 100 MG/10ML IV SOLN
INTRAVENOUS | Status: DC | PRN
  Administered 2013-02-14: 40 mg via INTRAVENOUS

## 2013-02-14 MED ORDER — SODIUM CHLORIDE 0.9 % IJ SOLN: 3.0000 mL | INTRAMUSCULAR | Status: DC | PRN

## 2013-02-14 MED ORDER — HYDROCODONE-ACETAMINOPHEN 5-325 MG PO TABS: 1.0000 | ORAL_TABLET | ORAL | Status: DC | PRN

## 2013-02-14 MED ORDER — ONDANSETRON HCL 4 MG/2ML IJ SOLN: 4.0000 mg | Freq: Once | INTRAMUSCULAR | Status: DC | PRN

## 2013-02-14 MED ORDER — CHLORHEXIDINE GLUCONATE 4 % EX LIQD: 1.0000 "application " | Freq: Once | CUTANEOUS | Status: DC

## 2013-02-14 MED ORDER — DEXTROSE 5 % IV SOLN
INTRAVENOUS | Status: DC | PRN
  Administered 2013-02-14: 08:00:00 via INTRAVENOUS

## 2013-02-14 MED ORDER — ONDANSETRON HCL 4 MG/2ML IJ SOLN
INTRAMUSCULAR | Status: DC | PRN
  Administered 2013-02-14: 4 mg via INTRAVENOUS

## 2013-02-14 MED ORDER — ACETAMINOPHEN 325 MG PO TABS: 650.0000 mg | ORAL_TABLET | ORAL | Status: DC | PRN

## 2013-02-14 MED ORDER — NEOSTIGMINE METHYLSULFATE 1 MG/ML IJ SOLN
INTRAMUSCULAR | Status: DC | PRN
  Administered 2013-02-14: 4 mg via INTRAVENOUS

## 2013-02-14 MED ORDER — LACTATED RINGERS IV SOLN
INTRAVENOUS | Status: DC | PRN
  Administered 2013-02-14 (×2): via INTRAVENOUS

## 2013-02-14 MED ORDER — SODIUM CHLORIDE 0.9 % IR SOLN
Status: DC | PRN
  Administered 2013-02-14: 1000 mL

## 2013-02-14 MED ORDER — BUPIVACAINE-EPINEPHRINE 0.25% -1:200000 IJ SOLN
INTRAMUSCULAR | Status: AC
  Filled 2013-02-14: qty 1

## 2013-02-14 MED ORDER — SODIUM CHLORIDE 0.9 % IJ SOLN: 3.0000 mL | Freq: Two times a day (BID) | INTRAMUSCULAR | Status: DC

## 2013-02-14 MED ORDER — SODIUM CHLORIDE 0.9 % IV SOLN
INTRAVENOUS | Status: DC | PRN
  Administered 2013-02-14: 08:00:00

## 2013-02-14 MED ORDER — ONDANSETRON HCL 4 MG/2ML IJ SOLN: 4.0000 mg | Freq: Four times a day (QID) | INTRAMUSCULAR | Status: DC | PRN

## 2013-02-14 MED ORDER — BUPIVACAINE-EPINEPHRINE 0.25% -1:200000 IJ SOLN
INTRAMUSCULAR | Status: DC | PRN
  Administered 2013-02-14: 50 mL

## 2013-02-14 MED ORDER — KETOROLAC TROMETHAMINE 30 MG/ML IJ SOLN: 30.0000 mg | Freq: Four times a day (QID) | INTRAMUSCULAR | Status: DC

## 2013-02-14 MED ORDER — SODIUM CHLORIDE 0.9 % IV SOLN: INTRAVENOUS | Status: DC

## 2013-02-14 MED ORDER — ACETAMINOPHEN 650 MG RE SUPP: 650.0000 mg | RECTAL | Status: DC | PRN

## 2013-02-14 MED ORDER — SODIUM CHLORIDE 0.9 % IV SOLN: 250.0000 mL | INTRAVENOUS | Status: DC | PRN

## 2013-02-14 SURGICAL SUPPLY — 43 items
APPLIER CLIP ROT 10 11.4 M/L (STAPLE) ×2
BLADE SURG ROTATE 9660 (MISCELLANEOUS) IMPLANT
CANISTER SUCTION 2500CC (MISCELLANEOUS) ×2 IMPLANT
CHLORAPREP W/TINT 26ML (MISCELLANEOUS) ×2 IMPLANT
CLIP APPLIE ROT 10 11.4 M/L (STAPLE) ×1 IMPLANT
CLOTH BEACON ORANGE TIMEOUT ST (SAFETY) ×2 IMPLANT
COVER MAYO STAND STRL (DRAPES) ×2 IMPLANT
COVER SURGICAL LIGHT HANDLE (MISCELLANEOUS) ×2 IMPLANT
DECANTER SPIKE VIAL GLASS SM (MISCELLANEOUS) ×4 IMPLANT
DERMABOND ADVANCED (GAUZE/BANDAGES/DRESSINGS) ×1
DERMABOND ADVANCED .7 DNX12 (GAUZE/BANDAGES/DRESSINGS) ×1 IMPLANT
DRAPE C-ARM 42X72 X-RAY (DRAPES) ×2 IMPLANT
DRAPE UTILITY 15X26 W/TAPE STR (DRAPE) ×4 IMPLANT
ELECT REM PT RETURN 9FT ADLT (ELECTROSURGICAL) ×2
ELECTRODE REM PT RTRN 9FT ADLT (ELECTROSURGICAL) ×1 IMPLANT
GLOVE BIO SURGEON STRL SZ7 (GLOVE) ×2 IMPLANT
GLOVE BIO SURGEON STRL SZ7.5 (GLOVE) ×2 IMPLANT
GLOVE BIOGEL PI IND STRL 7.0 (GLOVE) ×2 IMPLANT
GLOVE BIOGEL PI IND STRL 7.5 (GLOVE) ×1 IMPLANT
GLOVE BIOGEL PI INDICATOR 7.0 (GLOVE) ×2
GLOVE BIOGEL PI INDICATOR 7.5 (GLOVE) ×1
GLOVE EUDERMIC 7 POWDERFREE (GLOVE) ×2 IMPLANT
GLOVE SURG SIGNA 7.5 PF LTX (GLOVE) ×2 IMPLANT
GOWN PREVENTION PLUS XLARGE (GOWN DISPOSABLE) ×2 IMPLANT
GOWN STRL NON-REIN LRG LVL3 (GOWN DISPOSABLE) ×6 IMPLANT
GOWN STRL REIN XL XLG (GOWN DISPOSABLE) ×2 IMPLANT
KIT BASIN OR (CUSTOM PROCEDURE TRAY) ×2 IMPLANT
KIT ROOM TURNOVER OR (KITS) ×2 IMPLANT
NS IRRIG 1000ML POUR BTL (IV SOLUTION) ×2 IMPLANT
PAD ARMBOARD 7.5X6 YLW CONV (MISCELLANEOUS) ×2 IMPLANT
POUCH SPECIMEN RETRIEVAL 10MM (ENDOMECHANICALS) ×2 IMPLANT
SCISSORS LAP 5X35 DISP (ENDOMECHANICALS) ×2 IMPLANT
SET CHOLANGIOGRAPH 5 50 .035 (SET/KITS/TRAYS/PACK) ×2 IMPLANT
SET IRRIG TUBING LAPAROSCOPIC (IRRIGATION / IRRIGATOR) ×2 IMPLANT
SLEEVE ENDOPATH XCEL 5M (ENDOMECHANICALS) ×2 IMPLANT
SPECIMEN JAR SMALL (MISCELLANEOUS) ×2 IMPLANT
SUT MNCRL AB 4-0 PS2 18 (SUTURE) ×2 IMPLANT
TOWEL OR 17X24 6PK STRL BLUE (TOWEL DISPOSABLE) ×2 IMPLANT
TOWEL OR 17X26 10 PK STRL BLUE (TOWEL DISPOSABLE) ×2 IMPLANT
TRAY LAPAROSCOPIC (CUSTOM PROCEDURE TRAY) ×2 IMPLANT
TROCAR XCEL BLUNT TIP 100MML (ENDOMECHANICALS) ×2 IMPLANT
TROCAR XCEL NON-BLD 11X100MML (ENDOMECHANICALS) ×2 IMPLANT
TROCAR XCEL NON-BLD 5MMX100MML (ENDOMECHANICALS) ×2 IMPLANT

## 2013-02-14 NOTE — Anesthesia Procedure Notes (Signed)
Procedure Name: Intubation Date/Time: 02/14/2013 7:34 AM Performed by: Marni Griffon Pre-anesthesia Checklist: Patient identified, Emergency Drugs available, Suction available and Patient being monitored Patient Re-evaluated:Patient Re-evaluated prior to inductionOxygen Delivery Method: Circle system utilized Preoxygenation: Pre-oxygenation with 100% oxygen Intubation Type: IV induction Ventilation: Mask ventilation without difficulty Laryngoscope Size: Mac and 4 Grade View: Grade I Tube type: Oral Tube size: 8.0 mm Number of attempts: 1 Airway Equipment and Method: Stylet Placement Confirmation: ETT inserted through vocal cords under direct vision,  breath sounds checked- equal and bilateral and positive ETCO2 Secured at: 22 (cm at teeth) cm Tube secured with: Tape Dental Injury: Teeth and Oropharynx as per pre-operative assessment

## 2013-02-14 NOTE — Op Note (Signed)
Patient Name:           Alvin Howard   Date of Surgery:        02/14/2013  Pre op Diagnosis:      Chronic cholecystitis, gallbladder polyps  Post op Diagnosis:    Same  Procedure:                 Laparoscopic cholecystectomy with cholangiogram  Surgeon:                     Angelia Mould. Derrell Lolling, M.D., FACS  Assistant:                      Ovidio Kin, M.D., FACS  Operative Indications:   Alvin Howard is a 47 y.o. male. He was referred by Dr. Karilyn Cota and Dr. Assunta Found for consideration of cholecystectomy.  This patient has had symptoms since August of 2013. He has had non-exertional epigastric and substernal chest pressure. This is often, but not always after a heavy meal. He denies nausea or vomiting. This was thought to be reflux and he was placed on proton pump inhibitors. He had an extensive workup by Dr. Norwood Levo including echocardiogram, stress test and Holter monitor and was told that there was nothing abnormal except for occasional tachycardia for which he has been prescribed beta blockers. His last significant episode of pain was over Christmas when he ate at the [facility]. . Since that time he has had some minor episodes but nothing bad. He has  stringently avoided any fatty foods, salad dressings, red meats and has just been eating chicken and fish. He has lost about 14 pounds. Postprandial symptoms seem to be the most common pattern.  Upper endoscopy and colonoscopy were normal on 01/05/2013. Liver function tests and white count were normal on 12/16/2012. H. Pylori negative. Ultrasound shows an abnormal gallbladder with numerous polyps but no stones that moved. Prominent wall but not grossly thickened. CBD not dilated. Past history is significant in significant for what looks like a laparoscopy(unlikely) or peritoneal lavage(more likely)  at age 47 following blunt trauma. Performed in [place]. . No intra-abdominal injuries occurred to the patient. Small puncture wound below umbilicus is  the only scar. Colonoscopy previously showed colon adenomas. He has hyperlipidemia. Family history significant for colon cancer in the mother, cardiac disease, lung cancer, and gallbladder disease in the father. Mother also had ovarian cancer. The patient is Brought to the operating room electively.   Operative Findings:       The gallbladder was thin-walled and not acutely inflamed. However, there were extensive omental adhesions throughout the gallbladder. We could only visualize the fundus at the beginning of the case. The gallbladder was somewhat stuck to the liver, suggesting chronic inflammation. The cholangiogram looked normal, showing normal intrahepatic and extrahepatic biliary anatomy, no dilatation, no filling defects, and no obstruction with good flow of contrast into the duodenum. The liver, stomach, duodenum, small intestine and large intestine were grossly normal to inspection.  Procedure in Detail:          Following the induction of general endotracheal anesthesia the patient's abdomen was prepped and draped in a sterile fashion, intravenous antibiotics were given, and a surgical time out performed. 5% Marcaine with epinephrine was used as a local infiltration anesthetic. A vertical incision was made at the lower rim of the umbilicus, through the old scar. The fascia was incised in the midline and the abdominal cavity entered  under direct vision. An 11 mm Hassan trocar was inserted and secured with a purse string suture of 0 Vicryl. Pneumoperitoneum was created and a video camera inserted. An 11 mm trocar was placed in the subxiphoid region and two 5 mm trocars placed in the right upper quadrant. The gallbladder fundus was identified and elevated. The spent some  time taking the omental adhesions down off of the gallbladder which were extensive onto the body and infundibulum. These were chronic adhesions. We initially were able to elevate the infundibulum and dissect both of the ventral and  dorsal surfaces of the infundibulum and the neck of the gallbladder. We then isolated the cystic duct and the cystic artery and mobilized the infundibulum so we can have a critical view of safety and see  only 2 structures going to the gallbladder. A cholangiogram catheter was inserted in the cystic duct and a cholangiogram was obtained with the C-arm. The cholangiogram looked normal as described above. The cholangiocatheter was removed, the cystic duct secured with multiple metal clips and divided. The cystic artery was isolated as it went all the gallbladder, secured with multiple  hemoclips and divided. We found some posterior structures which looked like lymphatics going to the back wall the gallbladder and these were clipped and divided. We slowly dissected the gallbladder from its bed. We made one small hole in the  gallbladder and clear green bile came out, no stones. We dissected the gallbladder completely out of its  Bed, placed it in a specimen bag and removed it. We cauterized the liver bed a little bit where there were some small raw areas and then  irrigated the subphrenic and subhepatic space extensively and irrigation fluid  became completely clear. We inspected the clips on the cystic duct and cystic artery and they were secure without any drainage or leakage. After evacuating all irrigation fluid we removed the trocars. There was no bleeding from  the trocar sites. The pneumoperitoneum was released. Fascia at the umbilicus was closed with 0 Vicryl sutures and the skin incisions closed with subcuticular sutures of 4-0 Monocryl and Dermabond. The patient tolerated the procedure well and was taken to the recovery stable. There were no complications. Counts correct. EBL 10 cc.     Angelia Mould. Derrell Lolling, M.D., FACS General and Minimally Invasive Surgery Breast and Colorectal Surgery  02/14/2013 8:39 AM

## 2013-02-14 NOTE — Preoperative (Signed)
Beta Blockers   Reason not to administer Beta Blockers:Lopressor 0430

## 2013-02-14 NOTE — Transfer of Care (Signed)
Immediate Anesthesia Transfer of Care Note  Patient: Alvin Howard  Procedure(s) Performed: Procedure(s): LAPAROSCOPIC CHOLECYSTECTOMY WITH INTRAOPERATIVE CHOLANGIOGRAM (N/A)  Patient Location: PACU  Anesthesia Type:General  Level of Consciousness: awake, alert , oriented and patient cooperative  Airway & Oxygen Therapy: Patient Spontanous Breathing and Patient connected to nasal cannula oxygen  Post-op Assessment: Report given to PACU RN, Post -op Vital signs reviewed and stable and Patient moving all extremities  Post vital signs: Reviewed and stable  Complications: No apparent anesthesia complications

## 2013-02-14 NOTE — Anesthesia Postprocedure Evaluation (Signed)
  Anesthesia Post-op Note  Patient: Alvin Howard  Procedure(s) Performed: Procedure(s): LAPAROSCOPIC CHOLECYSTECTOMY WITH INTRAOPERATIVE CHOLANGIOGRAM (N/A)  Patient Location: PACU  Anesthesia Type:General  Level of Consciousness: awake, alert , oriented and patient cooperative  Airway and Oxygen Therapy: Patient Spontanous Breathing  Post-op Pain: mild  Post-op Assessment: Post-op Vital signs reviewed, Patient's Cardiovascular Status Stable, Respiratory Function Stable, Patent Airway, No signs of Nausea or vomiting and Pain level controlled  Post-op Vital Signs: stable  Complications: No apparent anesthesia complications

## 2013-02-14 NOTE — Interval H&P Note (Signed)
History and Physical Interval Note:  02/14/2013 6:57 AM  Alvin Howard  has presented today for surgery, with the diagnosis of gall bladder polyp   The goals and the various methods of treatment have been discussed with the patient and family. After consideration of risks, benefits and other options for treatment, the patient has consented to  Procedure(s): LAPAROSCOPIC CHOLECYSTECTOMY WITH INTRAOPERATIVE CHOLANGIOGRAM (N/A) as a surgical intervention .  The patient's history has been reviewed, patient examined today , no change in status, stable for surgery.  I have reviewed the patient's chart and labs.  Questions were answered to the patient's satisfaction.     Ernestene Mention

## 2013-02-14 NOTE — Addendum Note (Signed)
Addendum created 02/14/13 1109 by Marni Griffon, CRNA   Modules edited: Anesthesia Medication Administration

## 2013-02-14 NOTE — Anesthesia Preprocedure Evaluation (Addendum)
Anesthesia Evaluation  Patient identified by MRN, date of birth, ID band Patient awake    Reviewed: Allergy & Precautions, H&P , NPO status , Patient's Chart, lab work & pertinent test results, reviewed documented beta blocker date and time   History of Anesthesia Complications (+) Family history of anesthesia reaction  Airway Mallampati: I TM Distance: >3 FB Neck ROM: full    Dental  (+) Teeth Intact, Caps and Dental Advisory Given   Pulmonary          Cardiovascular + dysrhythmias Rhythm:regular Rate:Normal     Neuro/Psych Anxiety    GI/Hepatic GERD-  Medicated and Controlled,  Endo/Other    Renal/GU      Musculoskeletal   Abdominal   Peds  Hematology   Anesthesia Other Findings   Reproductive/Obstetrics                         Anesthesia Physical Anesthesia Plan  ASA: I  Anesthesia Plan: General   Post-op Pain Management:    Induction: Intravenous  Airway Management Planned: Oral ETT  Additional Equipment:   Intra-op Plan:   Post-operative Plan: Extubation in OR  Informed Consent: I have reviewed the patients History and Physical, chart, labs and discussed the procedure including the risks, benefits and alternatives for the proposed anesthesia with the patient or authorized representative who has indicated his/her understanding and acceptance.     Plan Discussed with: CRNA, Anesthesiologist and Surgeon  Anesthesia Plan Comments:         Anesthesia Quick Evaluation

## 2013-02-15 ENCOUNTER — Encounter (HOSPITAL_COMMUNITY): Payer: Self-pay | Admitting: General Surgery

## 2013-02-15 ENCOUNTER — Telehealth (INDEPENDENT_AMBULATORY_CARE_PROVIDER_SITE_OTHER): Payer: Self-pay | Admitting: General Surgery

## 2013-02-15 NOTE — Telephone Encounter (Signed)
Pt called to ask if OK to resume all his other medications, following his GB surgery yesterday.  Reassured him it is okay to take all his routine meds.  He is taking Norco for pain with barely adequate control, so suggested he augment with NSAIDs, which he will try.  Pt will call back with any other questions.

## 2013-02-22 ENCOUNTER — Encounter (INDEPENDENT_AMBULATORY_CARE_PROVIDER_SITE_OTHER): Payer: Self-pay | Admitting: General Surgery

## 2013-02-22 ENCOUNTER — Telehealth (INDEPENDENT_AMBULATORY_CARE_PROVIDER_SITE_OTHER): Payer: Self-pay | Admitting: *Deleted

## 2013-02-22 ENCOUNTER — Ambulatory Visit (INDEPENDENT_AMBULATORY_CARE_PROVIDER_SITE_OTHER): Payer: BC Managed Care – PPO | Admitting: General Surgery

## 2013-02-22 VITALS — BP 128/82 | HR 60 | Temp 98.4°F | Resp 12 | Ht 67.0 in | Wt 191.0 lb

## 2013-02-22 DIAGNOSIS — K824 Cholesterolosis of gallbladder: Secondary | ICD-10-CM

## 2013-02-22 NOTE — Telephone Encounter (Signed)
Called to speak with patient following receiving a request for follow up from Dr. Ezzard Standing.  Patient states swelling and pain at the umbilical site has improved this morning.  Patient states he still wants to come in for a PO follow up with Derrell Lolling MD this week after speaking with Ezzard Standing MD.  Appt made and agreed upon by patient.

## 2013-02-22 NOTE — Progress Notes (Signed)
Patient ID: Alvin Howard, male   DOB: 1966/01/08, 47 y.o.   MRN: [id number] History: This patient underwent elective laparoscopic cholecystectomy with cholangiogram 10/14/2013. Preop ultrasound showed gallbladder polyps. He had classic biliary symptoms. Intraoperative findings were a normal cholangiogram and extensive adhesions to the gallbladder. He is recovering he has no unusual pain. He is tolerating diet has normal bowel function. He noted some swelling to the right of the umbilicus yesterday and was concerned and came in for a wound check. It is not as swollen today. He has not been bleeding.  Exam: Patient looks well. No distress. Family with him Abdomen soft. Minimally tender. No sign of any hernia, hematoma, infection, or drainage. 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 Recovering without surgical complication History Swelling to right of umbilicus. No apparent surgical complication. Suspect self-limited tissue edema  Plan: Low fat diet No sports or heavy lifting for 3 weeks and the date of surgery. He has a strenuous job Return to see me in 3 weeks for final check.   Angelia Mould. Derrell Lolling, M.D., [facility] General and Minimally invasive Surgery Breast and Colorectal Surgery Office:   [phone] Pager:   [phone]

## 2013-02-22 NOTE — Patient Instructions (Signed)
You appear to be recovering from your gallbladder surgery without any obvious surgical complications.  There is no evidence of any bleeding, hernia, or infection.  Your wound is a little puffy, but that is typical.  No sports or lifting more than 20 pounds until 3 weeks from the date of surgery  Return to see Dr. Derrell Lolling in 3 weeks for a final check.

## 2013-02-23 ENCOUNTER — Telehealth (INDEPENDENT_AMBULATORY_CARE_PROVIDER_SITE_OTHER): Payer: Self-pay | Admitting: General Surgery

## 2013-02-23 NOTE — Telephone Encounter (Signed)
Called and left message for patient on home phone to advise of his post op appointment that has been set to see Dr. Derrell Lolling. Patient to see Dr. Derrell Lolling on 03/16/13 at 2:15. Advised patient to call if there are any problems and if unable to make that appointment.

## 2013-03-16 ENCOUNTER — Ambulatory Visit (INDEPENDENT_AMBULATORY_CARE_PROVIDER_SITE_OTHER): Payer: BC Managed Care – PPO | Admitting: General Surgery

## 2013-03-16 ENCOUNTER — Encounter (INDEPENDENT_AMBULATORY_CARE_PROVIDER_SITE_OTHER): Payer: Self-pay | Admitting: General Surgery

## 2013-03-16 VITALS — BP 110/72 | HR 56 | Temp 99.0°F | Resp 18 | Ht 67.0 in | Wt 197.6 lb

## 2013-03-16 DIAGNOSIS — K824 Cholesterolosis of gallbladder: Secondary | ICD-10-CM

## 2013-03-16 NOTE — Patient Instructions (Addendum)
You have recovered from your gallbladder surgery without any obvious surgical complications.  You may resume all normal physical activities without restriction.  Return to see Dr. Derrell Lolling if further problems arise.

## 2013-03-16 NOTE — Progress Notes (Signed)
Patient ID: Alvin Howard, male   DOB: 1966-03-17, 47 y.o.   MRN: [id number] History: This patient returns for a final visit following laparoscopic cholecystectomy with cholangiogram. Preop ultrasound showed gallbladder polyps. Classic biliary symptoms. Intraoperative findings were normal cholangiogram and extensive adhesions to the gallbladder. He has recovered and the surgery has has helped immensely. He has no more pain, no more nausea, no alteration in his bowel habits. He can eat anything he wants. He has stopped taking his proton pump inhibitors. No wound problems  Exam: Patient was well. No distress Abdomen soft and nontender. All wounds well healed. 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 With complete resolution of symptoms, he must been having biliary colic. He is recovered without surgical complication  Plan diet and  activities discussed No restrictions Return to see me if further problems arise.   Angelia Mould. Derrell Lolling, M.D., [facility] General and Minimally invasive Surgery Breast and Colorectal Surgery Office:   [phone] Pager:   [phone]

## 2013-10-27 ENCOUNTER — Other Ambulatory Visit: Payer: Self-pay

## 2013-12-28 ENCOUNTER — Other Ambulatory Visit: Payer: Self-pay | Admitting: *Deleted

## 2013-12-28 MED ORDER — SIMVASTATIN 20 MG PO TABS: 20.0000 mg | ORAL_TABLET | Freq: Every day | ORAL | Status: DC

## 2013-12-29 IMAGING — CR DG CHEST 2V
2 series · 2 of 2 positions shown · non-contrast
Comparison: 09/07/2007

CLINICAL DATA: Mid chest pain

CHEST - 2 VIEW

[view not recorded (1 of 2)]
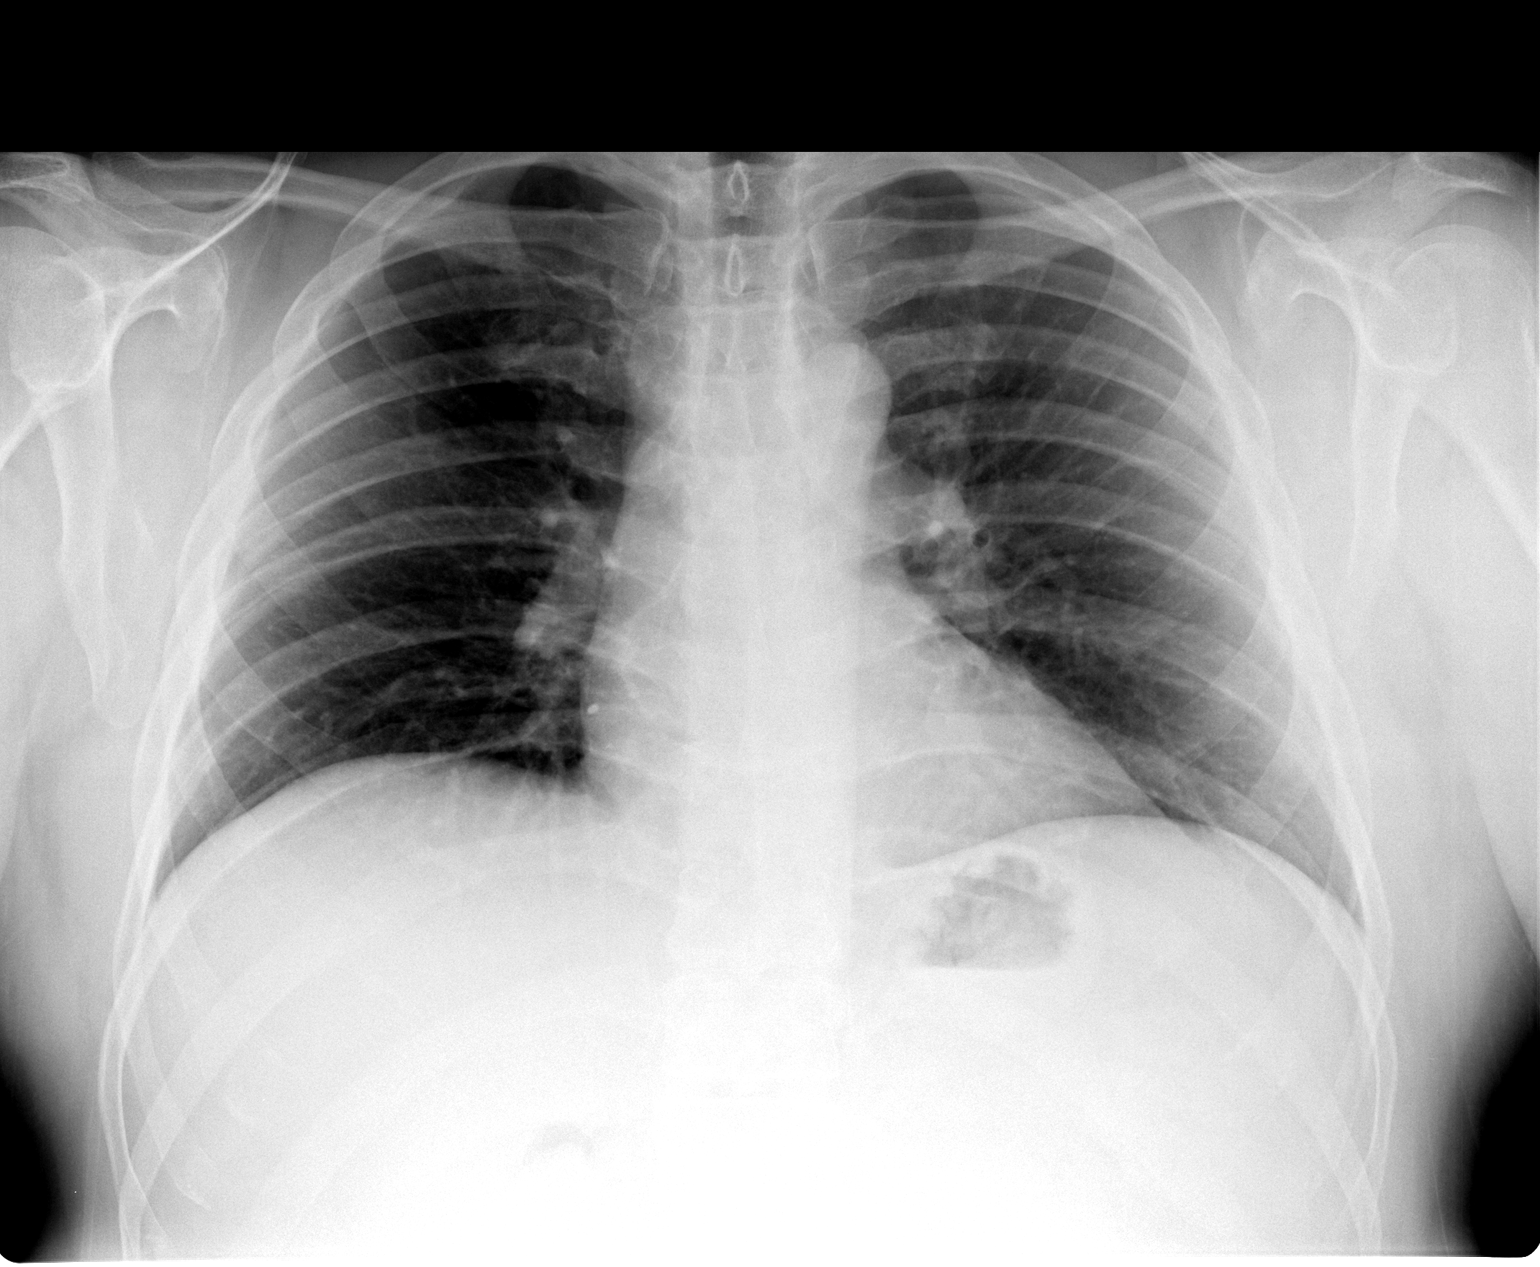

[view not recorded (2 of 2)]
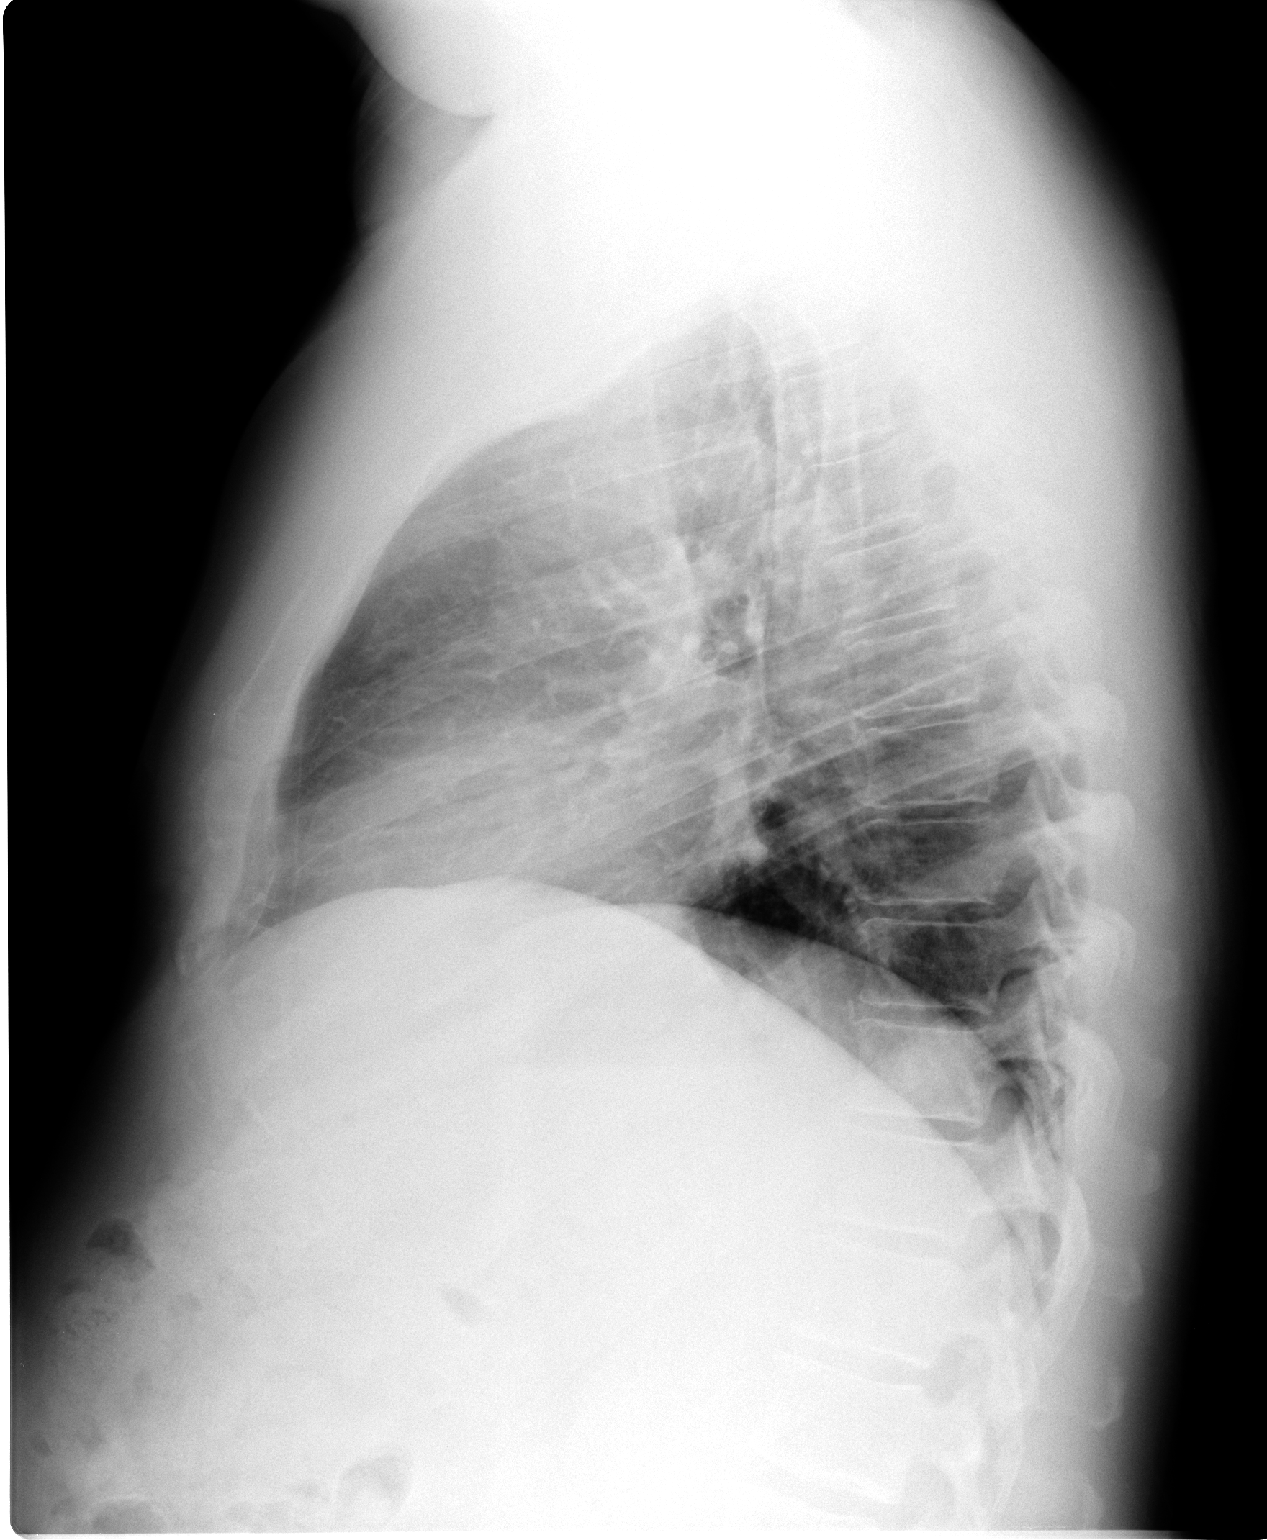

[2 of 2 positions shown; findings below may reference images not displayed]

FINDINGS: Lungs are clear. No pleural effusion or pneumothorax.

Cardiomediastinal silhouette is within normal limits.

Mild degenerative changes of the visualized thoracolumbar spine.
IMPRESSION: Normal chest radiographs.

## 2014-06-05 IMAGING — US US ABDOMEN COMPLETE
1 series · 14 of 25 positions shown · non-contrast
Comparison: None.

CLINICAL DATA: Heartburn.  Increased cholesterol.  History of
gastroesophageal reflux disease.

COMPLETE ABDOMINAL ULTRASOUND

[Series 1: us abdomen complete · 0.30mm/px · 14 of 109 slices shown]
[im 1/109]
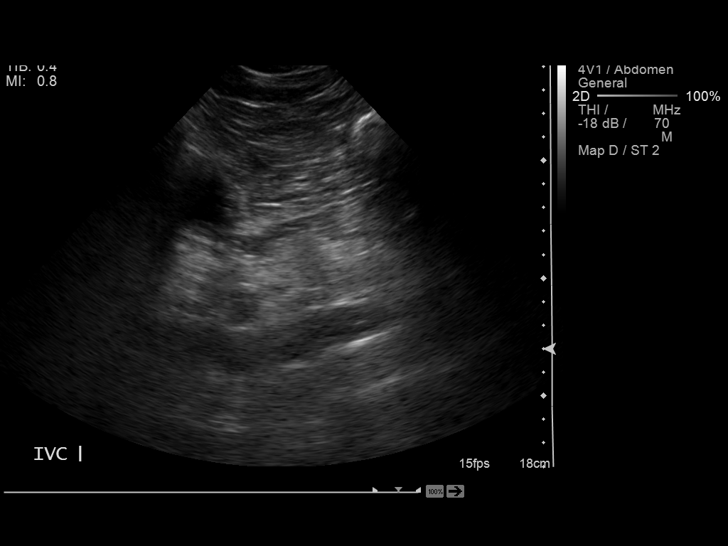
[im 10/109]
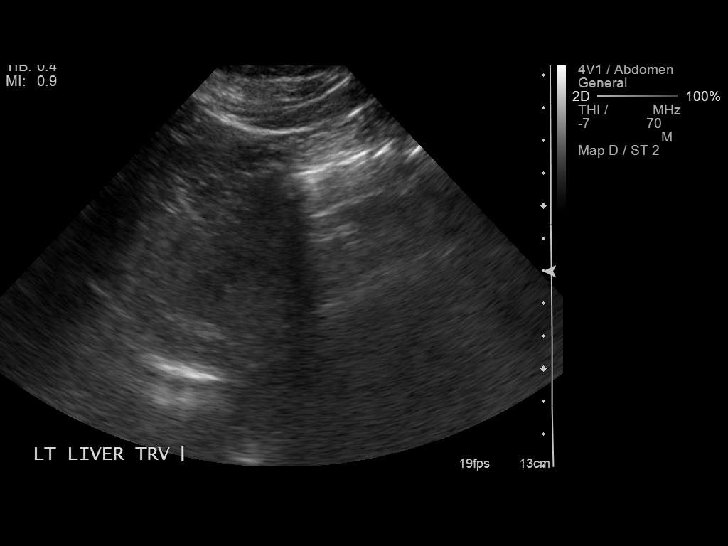
[im 19/109]
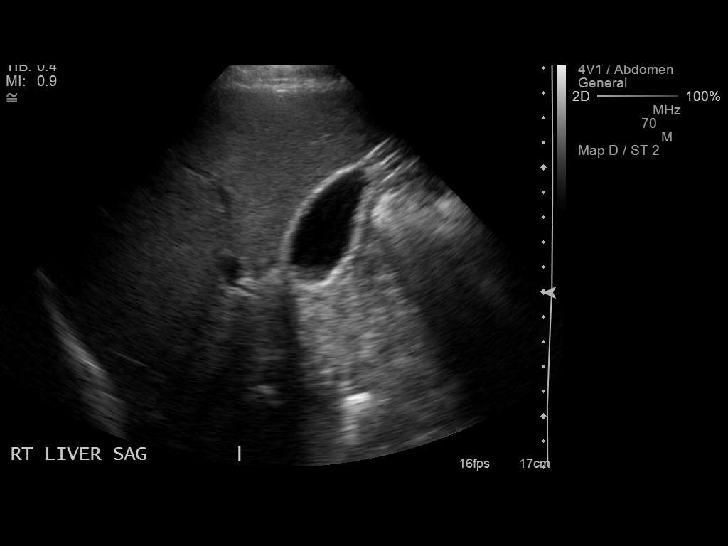
[im 28/109]
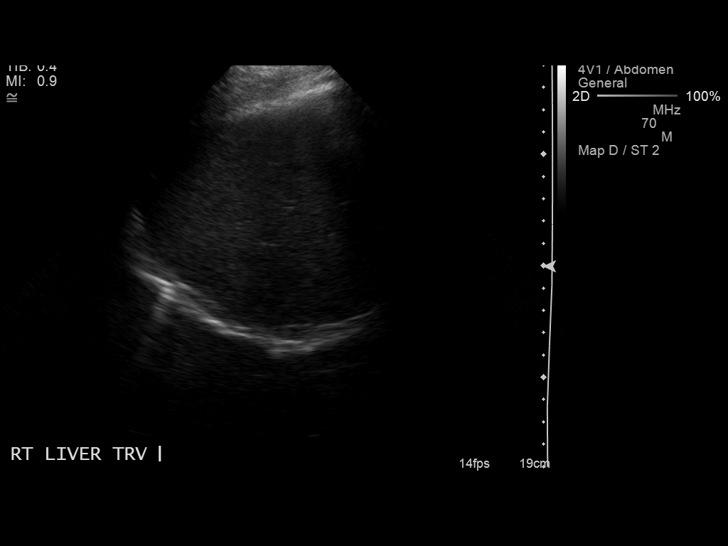
[im 37/109]
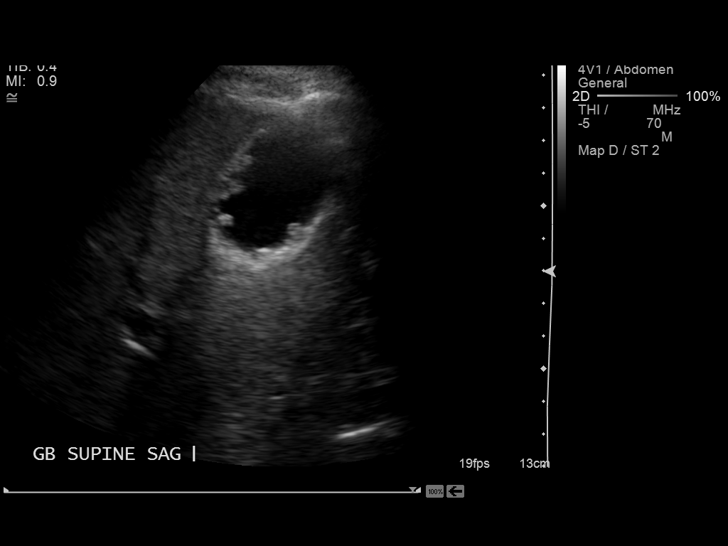
[im 41/109]
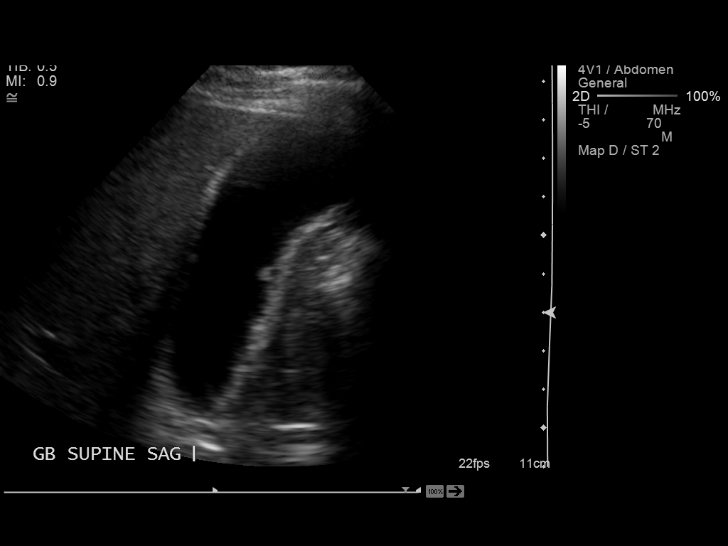
[im 50/109]
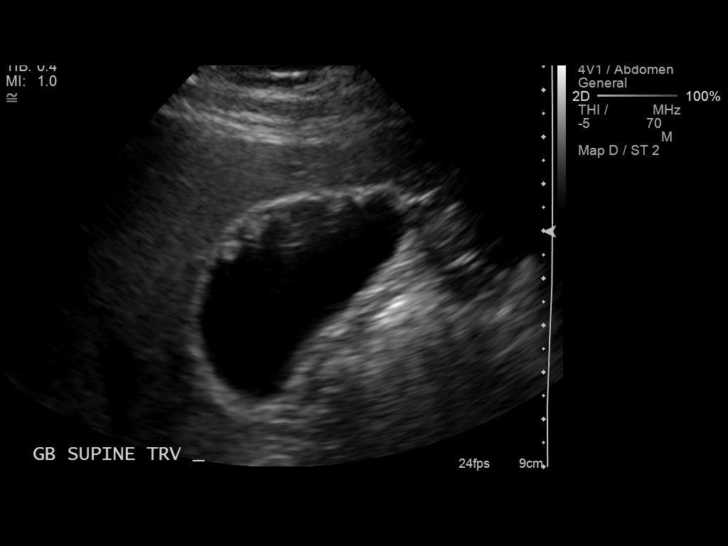
[im 59/109]
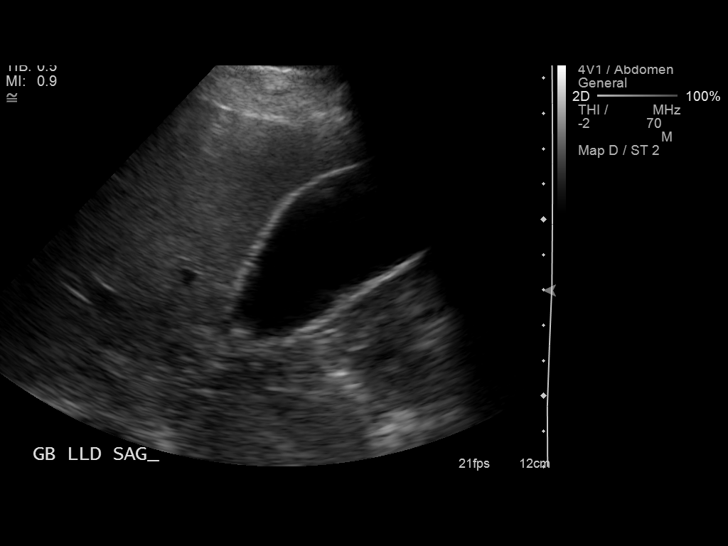
[im 68/109]
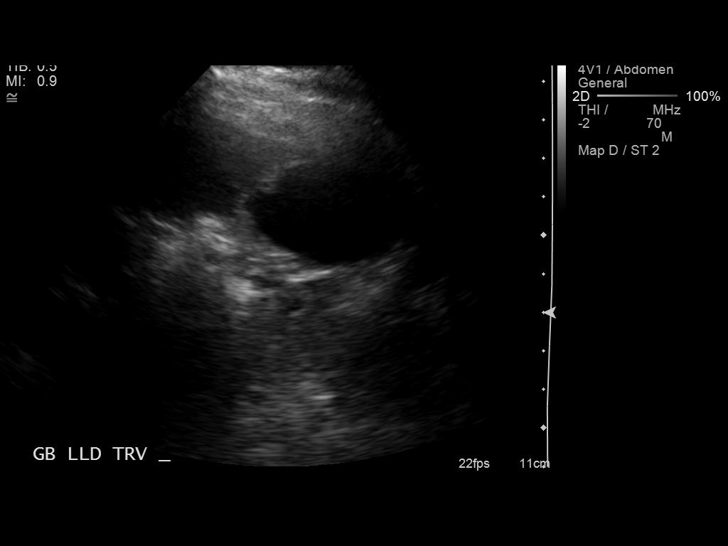
[im 73/109]
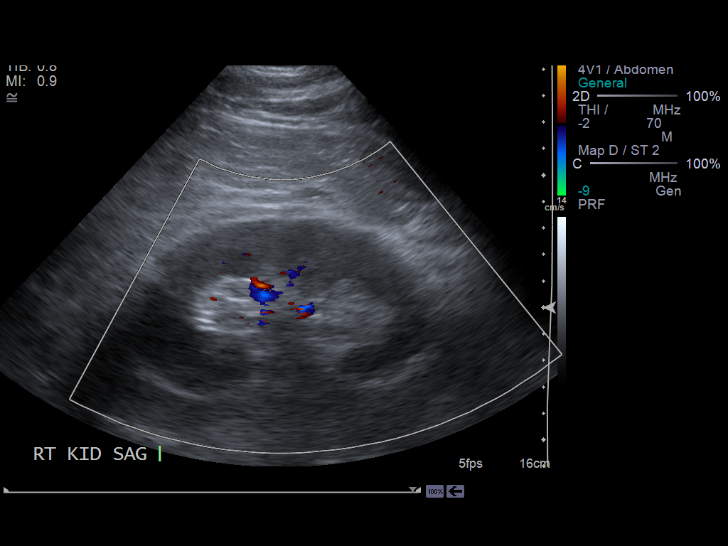
[im 82/109]
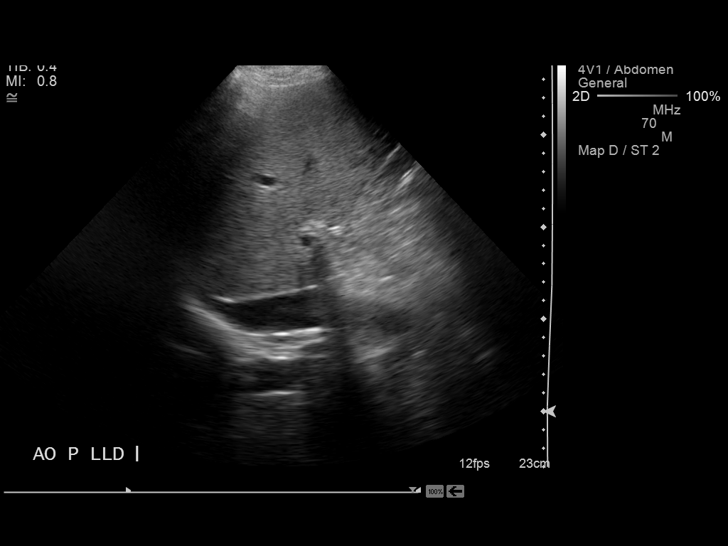
[im 91/109]
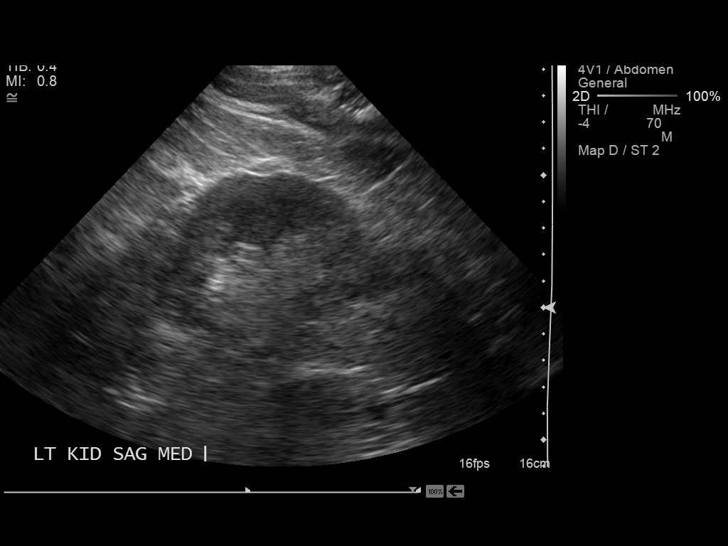
[im 100/109]
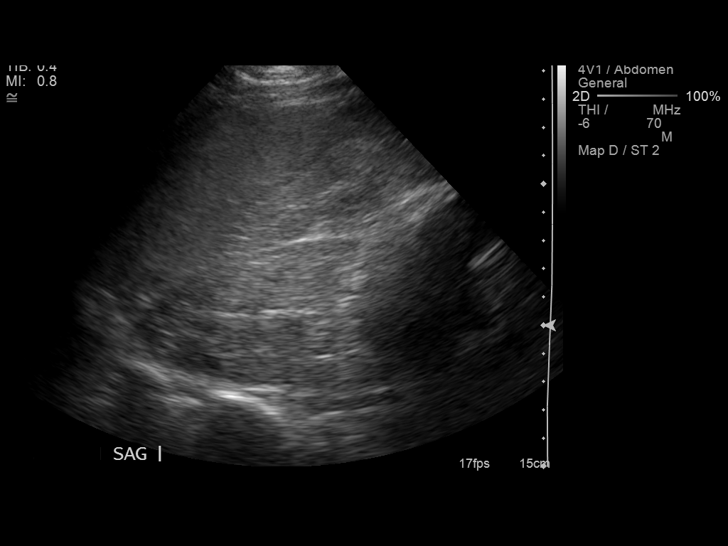
[im 109/109]
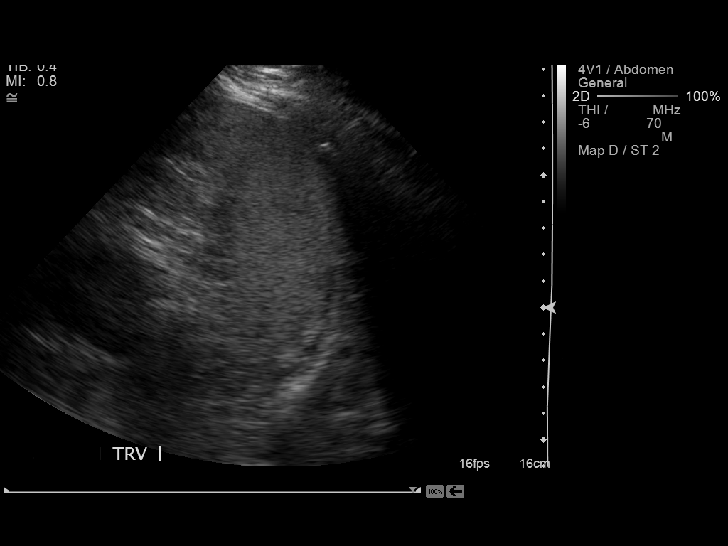

[14 of 25 positions shown; findings below may reference images not displayed]

FINDINGS: Gallbladder:  The gallbladder is distended.  There are multiple
hyperechoic foci along the wall, non mobile, consistent with
multiple small polyps.  The wall is prominent but not overtly
thickened measuring 2.4 mm.  No pericholecystic fluid.  No
shadowing stones.

Common bile duct:  Common bile duct is normal in caliber measuring
3.1 mm.  No duct stones.

Liver:  Left lobe of the liver not well seen due to bowel gas and
body habitus.  Normal appearance of the right lobe.

IVC:  Within normal limits

Pancreas:  Pancreas was obscured by overlying bowel gas

Spleen:  Normal in size and echogenicity measuring 9.1 cm

Right Kidney:  Normal measuring 11.5 cm

Left Kidney:  Normal measuring 12.5 cm

Abdominal aorta:  No aneurysm identified.
IMPRESSION: Abnormal appearance of the gallbladder with numerous small polyps
and prominence of the wall, but no shadowing stones or evidence of
acute cholecystitis.

Pancreas not visualized due to bowel gas.

Exam otherwise unremarkable.

## 2014-07-06 ENCOUNTER — Other Ambulatory Visit: Payer: Self-pay | Admitting: Dermatology

## 2015-07-18 ENCOUNTER — Encounter: Payer: Self-pay | Admitting: *Deleted

## 2015-08-15 ENCOUNTER — Encounter: Payer: Self-pay | Admitting: Cardiovascular Disease

## 2015-08-15 ENCOUNTER — Encounter: Payer: Self-pay | Admitting: Internal Medicine

## 2015-09-24 ENCOUNTER — Other Ambulatory Visit: Payer: Self-pay | Admitting: Urology

## 2015-09-24 DIAGNOSIS — N433 Hydrocele, unspecified: Secondary | ICD-10-CM

## 2015-09-26 ENCOUNTER — Other Ambulatory Visit: Payer: Self-pay | Admitting: Urology

## 2015-09-26 DIAGNOSIS — N433 Hydrocele, unspecified: Secondary | ICD-10-CM

## 2015-10-01 ENCOUNTER — Ambulatory Visit (HOSPITAL_COMMUNITY)
Admission: RE | Admit: 2015-10-01 | Discharge: 2015-10-01 | Disposition: A | Discharge status: Home / Self Care | Payer: BLUE CROSS/BLUE SHIELD | Source: Ambulatory Visit | Attending: Urology | Admitting: Urology

## 2015-10-01 DIAGNOSIS — I861 Scrotal varices: Secondary | ICD-10-CM | POA: Insufficient documentation

## 2015-10-01 DIAGNOSIS — N433 Hydrocele, unspecified: Secondary | ICD-10-CM | POA: Diagnosis not present

## 2015-10-01 HISTORY — DX: Hydrocele, unspecified: N43.3

## 2017-09-04 ENCOUNTER — Encounter (HOSPITAL_COMMUNITY): Payer: Self-pay | Admitting: Pharmacy Technician

## 2017-09-04 ENCOUNTER — Observation Stay (HOSPITAL_COMMUNITY)
Admission: EM | Admit: 2017-09-04 | Discharge: 2017-09-05 | Disposition: A | Discharge status: Home / Self Care | Payer: BLUE CROSS/BLUE SHIELD | Attending: Internal Medicine | Admitting: Internal Medicine

## 2017-09-04 ENCOUNTER — Emergency Department (HOSPITAL_BASED_OUTPATIENT_CLINIC_OR_DEPARTMENT_OTHER): Payer: BLUE CROSS/BLUE SHIELD

## 2017-09-04 ENCOUNTER — Emergency Department (HOSPITAL_COMMUNITY): Payer: BLUE CROSS/BLUE SHIELD

## 2017-09-04 ENCOUNTER — Observation Stay (HOSPITAL_COMMUNITY): Payer: BLUE CROSS/BLUE SHIELD

## 2017-09-04 DIAGNOSIS — Z823 Family history of stroke: Secondary | ICD-10-CM | POA: Diagnosis not present

## 2017-09-04 DIAGNOSIS — R410 Disorientation, unspecified: Secondary | ICD-10-CM | POA: Diagnosis not present

## 2017-09-04 DIAGNOSIS — I371 Nonrheumatic pulmonary valve insufficiency: Secondary | ICD-10-CM

## 2017-09-04 DIAGNOSIS — R200 Anesthesia of skin: Secondary | ICD-10-CM | POA: Diagnosis not present

## 2017-09-04 DIAGNOSIS — F411 Generalized anxiety disorder: Secondary | ICD-10-CM | POA: Insufficient documentation

## 2017-09-04 DIAGNOSIS — Z8249 Family history of ischemic heart disease and other diseases of the circulatory system: Secondary | ICD-10-CM | POA: Diagnosis not present

## 2017-09-04 DIAGNOSIS — E785 Hyperlipidemia, unspecified: Secondary | ICD-10-CM

## 2017-09-04 DIAGNOSIS — R4701 Aphasia: Secondary | ICD-10-CM

## 2017-09-04 DIAGNOSIS — Z7982 Long term (current) use of aspirin: Secondary | ICD-10-CM | POA: Diagnosis not present

## 2017-09-04 DIAGNOSIS — F419 Anxiety disorder, unspecified: Secondary | ICD-10-CM | POA: Diagnosis not present

## 2017-09-04 DIAGNOSIS — E7800 Pure hypercholesterolemia, unspecified: Secondary | ICD-10-CM

## 2017-09-04 DIAGNOSIS — Z79899 Other long term (current) drug therapy: Secondary | ICD-10-CM

## 2017-09-04 DIAGNOSIS — K219 Gastro-esophageal reflux disease without esophagitis: Secondary | ICD-10-CM | POA: Diagnosis not present

## 2017-09-04 DIAGNOSIS — G459 Transient cerebral ischemic attack, unspecified: Secondary | ICD-10-CM | POA: Diagnosis not present

## 2017-09-04 LAB — I-STAT CHEM 8, ED
BUN: 14 mg/dL (ref 6–20)
CALCIUM ION: 1.16 mmol/L (ref 1.15–1.40)
CHLORIDE: 105 mmol/L (ref 101–111)
Creatinine, Ser: 0.8 mg/dL (ref 0.61–1.24)
GLUCOSE: 100 mg/dL — AB (ref 65–99)
HCT: 42 % (ref 39.0–52.0)
Hemoglobin: 14.3 g/dL (ref 13.0–17.0)
POTASSIUM: 3.8 mmol/L (ref 3.5–5.1)
Sodium: 141 mmol/L (ref 135–145)
TCO2: 26 mmol/L (ref 22–32)

## 2017-09-04 LAB — CBC
HCT: 40.9 % (ref 39.0–52.0)
HEMOGLOBIN: 14.1 g/dL (ref 13.0–17.0)
MCH: 28.9 pg (ref 26.0–34.0)
MCHC: 34.5 g/dL (ref 30.0–36.0)
MCV: 83.8 fL (ref 78.0–100.0)
PLATELETS: 191 10*3/uL (ref 150–400)
RBC: 4.88 MIL/uL (ref 4.22–5.81)
RDW: 12.4 % (ref 11.5–15.5)
WBC: 4 10*3/uL (ref 4.0–10.5)

## 2017-09-04 LAB — PROTIME-INR
INR: 0.89
Prothrombin Time: 12 seconds (ref 11.4–15.2)

## 2017-09-04 LAB — DIFFERENTIAL
Basophils Absolute: 0 10*3/uL (ref 0.0–0.1)
Basophils Relative: 1 %
EOS PCT: 2 %
Eosinophils Absolute: 0.1 10*3/uL (ref 0.0–0.7)
LYMPHS ABS: 1.3 10*3/uL (ref 0.7–4.0)
LYMPHS PCT: 33 %
MONO ABS: 0.3 10*3/uL (ref 0.1–1.0)
Monocytes Relative: 8 %
NEUTROS ABS: 2.2 10*3/uL (ref 1.7–7.7)
Neutrophils Relative %: 56 %

## 2017-09-04 LAB — I-STAT TROPONIN, ED: Troponin i, poc: 0 ng/mL (ref 0.00–0.08)

## 2017-09-04 LAB — COMPREHENSIVE METABOLIC PANEL
ALK PHOS: 51 U/L (ref 38–126)
ALT: 47 U/L (ref 17–63)
ANION GAP: 3 — AB (ref 5–15)
AST: 32 U/L (ref 15–41)
Albumin: 4.1 g/dL (ref 3.5–5.0)
BILIRUBIN TOTAL: 0.6 mg/dL (ref 0.3–1.2)
BUN: 11 mg/dL (ref 6–20)
CALCIUM: 9 mg/dL (ref 8.9–10.3)
CO2: 28 mmol/L (ref 22–32)
CREATININE: 0.84 mg/dL (ref 0.61–1.24)
Chloride: 108 mmol/L (ref 101–111)
GFR calc non Af Amer: >60 mL/min (ref >60)
GLUCOSE: 103 mg/dL — AB (ref 65–99)
Potassium: 3.9 mmol/L (ref 3.5–5.1)
SODIUM: 139 mmol/L (ref 135–145)
TOTAL PROTEIN: 6.7 g/dL (ref 6.5–8.1)

## 2017-09-04 LAB — APTT: aPTT: 29 seconds (ref 24–36)

## 2017-09-04 LAB — CBG MONITORING, ED: Glucose-Capillary: 101 mg/dL — ABNORMAL HIGH (ref 65–99)

## 2017-09-04 MED ORDER — SIMVASTATIN 20 MG PO TABS
20.0000 mg | ORAL_TABLET | Freq: Every day | ORAL | Status: DC
  Filled 2017-09-04: qty 1

## 2017-09-04 MED ORDER — POLYETHYLENE GLYCOL 3350 17 G PO PACK: 17.0000 g | PACK | Freq: Every day | ORAL | Status: DC | PRN

## 2017-09-04 MED ORDER — METOPROLOL TARTRATE 25 MG PO TABS
25.0000 mg | ORAL_TABLET | Freq: Every day | ORAL | Status: DC
  Filled 2017-09-04 (×2): qty 1

## 2017-09-04 MED ORDER — ONDANSETRON HCL 4 MG PO TABS: 4.0000 mg | ORAL_TABLET | Freq: Four times a day (QID) | ORAL | Status: DC | PRN

## 2017-09-04 MED ORDER — ENOXAPARIN SODIUM 40 MG/0.4ML ~~LOC~~ SOLN
40.0000 mg | SUBCUTANEOUS | Status: DC
  Administered 2017-09-04: 40 mg via SUBCUTANEOUS
  Filled 2017-09-04: qty 0.4

## 2017-09-04 MED ORDER — ASPIRIN 81 MG PO CHEW
81.0000 mg | CHEWABLE_TABLET | Freq: Every day | ORAL | Status: DC
  Administered 2017-09-05: 81 mg via ORAL
  Filled 2017-09-04: qty 1

## 2017-09-04 MED ORDER — ONDANSETRON HCL 4 MG/2ML IJ SOLN: 4.0000 mg | Freq: Four times a day (QID) | INTRAMUSCULAR | Status: DC | PRN

## 2017-09-04 MED ORDER — LORAZEPAM 2 MG/ML IJ SOLN
1.0000 mg | Freq: Once | INTRAMUSCULAR | Status: AC
  Administered 2017-09-04: 1 mg via INTRAVENOUS
  Filled 2017-09-04: qty 1

## 2017-09-04 MED ORDER — ESCITALOPRAM OXALATE 10 MG PO TABS
10.0000 mg | ORAL_TABLET | Freq: Every day | ORAL | Status: DC
  Filled 2017-09-04 (×2): qty 1

## 2017-09-04 MED ORDER — ACETAMINOPHEN 325 MG PO TABS: 650.0000 mg | ORAL_TABLET | Freq: Four times a day (QID) | ORAL | Status: DC | PRN

## 2017-09-04 MED ORDER — LORAZEPAM 1 MG PO TABS: 1.0000 mg | ORAL_TABLET | Freq: Once | ORAL | Status: DC

## 2017-09-04 MED ORDER — ACETAMINOPHEN 650 MG RE SUPP: 650.0000 mg | Freq: Four times a day (QID) | RECTAL | Status: DC | PRN

## 2017-09-04 MED ORDER — ASPIRIN 81 MG PO CHEW
324.0000 mg | CHEWABLE_TABLET | Freq: Once | ORAL | Status: AC
  Administered 2017-09-04: 324 mg via ORAL
  Filled 2017-09-04: qty 4

## 2017-09-04 MED ORDER — LORAZEPAM 0.5 MG PO TABS: 0.5000 mg | ORAL_TABLET | Freq: Two times a day (BID) | ORAL | Status: DC | PRN

## 2017-09-04 NOTE — Progress Notes (Signed)
  Echocardiogram 2D Echocardiogram has been performed.  Alvin Howard 09/04/2017, 4:42 PM

## 2017-09-04 NOTE — Consult Note (Signed)
Referring Physician: Dr. Particia Nearing    Chief Complaint: Acute onset of confusion and LUE sensory disturbance  HPI: Alvin Howard is an 51 y.o. male who presented to the ED for evaluation of acute onset confusion with left arm sensory numbness and "trouble getting the words out". His symptoms improved somewhat after he took part of an Ativan pill. He has had a prior episode in the past with similar symptoms, also relieved with Ativan; at that time he had been under stress due to a death in the family. He states that he recently has also been under some stress. 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   ASA is listed as one of his home medications, but he does not take this regularly.   LSN: 1000 tPA Given: No: NIHSS = 0  Past Medical History:  Diagnosis Date  . Anxiety   . Dysrhythmia    rapid  . Family history of anesthesia complication    Mother- N/V  . GERD (gastroesophageal reflux disease)   . High cholesterol   . Internal hemorrhoid    per colonoscopy    Past Surgical History:  Procedure Laterality Date  . CHOLECYSTECTOMY N/A 02/14/2013   Procedure: LAPAROSCOPIC CHOLECYSTECTOMY WITH INTRAOPERATIVE CHOLANGIOGRAM;  Surgeon: Ernestene Mention, MD;  Location: [facility] OR;  Service: General;  Laterality: N/A;  . COLONOSCOPY    . COLONOSCOPY WITH ESOPHAGOGASTRODUODENOSCOPY (EGD)  01/20/2013   Procedure: COLONOSCOPY WITH ESOPHAGOGASTRODUODENOSCOPY (EGD);  Surgeon: Malissa Hippo, MD;  Location: [facility];  Service: Endoscopy;  Laterality: N/A;  100  . EXPLORATORY LAPAROTOMY     as a child  . NM MYOVIEW LTD  09/01/2012   normal    Family History  Problem Relation Age of Onset  . Colon cancer Mother   . Cancer Mother        colon, ovarian  . Cancer Father        lung   Social History:  reports that he has never smoked. He does not have any smokeless tobacco history on file. He reports that he drinks alcohol. He  reports that he does not use drugs.  Allergies: No Known Allergies  Home Medications:    ROS: As per HPI. Does not endorse additional symptoms.   Physical Examination: Blood pressure (!) 158/105, pulse 76, temperature 99.1 F (37.3 C), temperature source Oral, resp. rate (!) 23, height  (1.702 m), weight 99.8 kg (220 lb), SpO2 95 %.  HEENT: [place]/AT Lungs: Respirations unlabored Ext: No edema  Neurologic Examination: Mental Status: Alert, oriented, thought content appropriate.  Speech fluent without evidence of aphasia.  Able to follow all commands without difficulty. Cranial Nerves: II:  Visual fields intact. PERRL  III,IV, VI: ptosis not present, extra-ocular motions intact bilaterally V,VII: smile symmetric, facial light touch sensation normal bilaterally VIII: hearing normal bilaterally IX,X: No hypophonia or hoarseness XI: bilateral shoulder shrug symmetric XII: midline tongue extension  Motor: Right : Upper extremity   5/5    Left:     Upper extremity   5/5  Lower extremity   5/5     Lower extremity   5/5 Normal tone throughout; no atrophy noted Sensory: Temp and light touch intact throughout, bilaterally. No extinction Deep Tendon Reflexes:  Normoactive x 4 without asymmetry Plantars: Right: downgoing  Left: downgoing Cerebellar: No ataxia with FNF bilaterally Gait: Deferred due to acuity of presentation  Results for orders placed or performed  during the hospital encounter of 09/04/17 (from the past 48 hour(s))  CBG monitoring, ED     Status: Abnormal   Collection Time: 09/04/17  1:14 PM  Result Value Ref Range   Glucose-Capillary 101 (H) 65 - 99 mg/dL  I-Stat Chem 8, ED     Status: Abnormal   Collection Time: 09/04/17  1:25 PM  Result Value Ref Range   Sodium 141 135 - 145 mmol/L   Potassium 3.8 3.5 - 5.1 mmol/L   Chloride 105 101 - 111 mmol/L   BUN 14 6 - 20 mg/dL   Creatinine, Ser 1.61 0.61 - 1.24 mg/dL   Glucose, Bld 096 (H) 65 - 99 mg/dL   Calcium,  Ion 0.45 4.09 - 1.40 mmol/L   TCO2 26 22 - 32 mmol/L   Hemoglobin 14.3 13.0 - 17.0 g/dL   HCT 81.1 91.4 - 78.2 %   Ct Head Code Stroke Wo Contrast  Result Date: 09/04/2017 CLINICAL DATA:  Code stroke. 51 year old male with confusion and left side weakness. EXAM: CT HEAD WITHOUT CONTRAST TECHNIQUE: Contiguous axial images were obtained from the base of the skull through the vertex without intravenous contrast. COMPARISON:  None. FINDINGS: Brain: No midline shift, ventriculomegaly, mass effect, evidence of mass lesion, intracranial hemorrhage or evidence of cortically based acute infarction. Gray-white matter differentiation is within normal limits throughout the brain. Vascular: No suspicious intracranial vascular hyperdensity. Skull: Negative.  No acute osseous abnormality identified. Sinuses/Orbits: Clear. Other: Visualized orbits and scalp soft tissues are within normal limits. ASPECTS [facility] Stroke Program Early CT Score) - Ganglionic level infarction (caudate, lentiform nuclei, internal capsule, insula, M1-M3 cortex): 7 - Supraganglionic infarction (M4-M6 cortex): 3 Total score (0-10 with 10 being normal): 10 IMPRESSION: 1. Normal noncontrast CT appearance of the brain. 2. ASPECTS is 10. 3. The above was relayed via text pager to Dr. Agnes Lawrence on 09/04/2017 at 13:14 . Electronically Signed   By: Odessa Fleming M.D.   On: 09/04/2017 13:14    Assessment: 51 y.o. male with acute onset of dysphasia and LUE sensory disturbance 1. Neurological exam nonfocal with NIHSS = 0 2. CT head negative 3. Stroke Risk Factors - dysrhythmia and hypercholesterolemia  Plan: 1. HgbA1c, fasting lipid panel 2. MRI, MRA of the brain without contrast 3. PT consult, OT consult, Speech consult 4. Echocardiogram 5. Carotid dopplers 6. ASA 81 mg po qd on a consistent basis 7. Risk factor modification 8. Telemetry monitoring 9. Frequent neuro checks   signed: Dr. Caryl Pina  09/04/2017, 1:29 PM

## 2017-09-04 NOTE — Code Documentation (Signed)
51yo male arriving to [facility] via private vehicle at 60.  Patient was LKW at 1000.  Patient reports sudden onset of difficulty getting his words out and left arm numbness and tingling.  Of note, patient reports recent stress r/t the deaths of family members.  He reports being prescribed Ativan after his mother-in-law passed away.  He describes a similar episode of symptoms related to stress/anxiety.  Patient took a dose of Ativan on the way to the hospital today and states symptoms have improved.  Code stroke called on patient arrival to the ED.  Patient to CT.  Stroke team to the bedside.  NIHSS 0, see documentation for details and code stroke times.  Patient continues to report left arm tingling on exam.  Dr. Otelia Limes to the bedside.  TIA alert per MD.  Bedside handoff with ED RN Crystal.

## 2017-09-04 NOTE — H&P (Signed)
Date: 09/04/2017               Patient Name:  Alvin Howard MRN: [id number]  DOB: [birth year] Age / Sex: 51 y.o., male   PCP: Assunta Found, MD         Medical Service: Internal Medicine Teaching Service         Attending Physician: Dr. Gust Rung, DO    First Contact: Dr. Crista Elliot Pager: [phone]  Second Contact: Dr. Nelson Chimes Pager: [phone]       After Hours (After 5p/  First Contact Pager: [phone]  weekends / holidays): Second Contact Pager: [phone]   Chief Complaint: "confusion and arm numbness"  History of Present Illness: Mr. Alvin Howard is a 51 year old male who presented with sudden onset confusion and left arm numbness while attempting to step into his care. He has a past medical history significant for anxiety and GERD. Patient stated that he was on his way to pick up some Food when upon returning to his car he became confused and began to experience numbness in his left arm. He states that he was unable to recall his name words for a brief period of time but denied headache, loss of vision, syncope, loss of bowel or bladder control, tremor, shaking, palpitations. Patient states that the symptoms rapidly improved following the ingestion of a 0.25 of his  Ativan tablet. 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 He was initially diagnosed with anxiety approximately 5 years prior following the death of his mother-in-law.  Patient denied headache, loss of vision, syncope, nausea, vomiting, abdominal pain, chest pain, fever, or chills. Patient stated that his numbness had resolved, he no longer felt confused and was fully returned to baseline at the time of the exam.   In the ED neurology was consult and code stroke was called from triage. CT head without contrast was performed and was negative for acute intracranial process. CBG, CBC, CMP, troponins, APTT, were unremarkable. IMTS  was called for unassigned admission to permit Neurology to evaluate the patient for possible TIA.  Meds:  Current Meds  Medication Sig  . fish oil-omega-3 fatty acids 1000 MG capsule Take 2 g by mouth daily.   Marland Kitchen LORazepam (ATIVAN) 1 MG tablet Take 0.25 mg by mouth once.  . Nutritional Supplements (JUICE PLUS FIBRE PO) Take 3 capsules by mouth 2 (two) times daily.  . [DISCONTINUED] ALPRAZolam (XANAX) 1 MG tablet Take 0.25 mg by mouth once.     Allergies: Allergies as of 09/04/2017  . (No Known Allergies)   Past Medical History:  Diagnosis Date  . Anxiety   . Dysrhythmia    rapid  . Family history of anesthesia complication    Mother- N/V  . GERD (gastroesophageal reflux disease)   . High cholesterol   . Internal hemorrhoid    per colonoscopy    Family History:  Family History  Problem Relation Age of Onset  . Colon cancer Mother   . Cancer Mother        colon, ovarian  . Cancer Father        lung   Social History:  Social History   Social History  . Marital status: Married    Spouse name: N/A  . Number of children: N/A  . Years of education: N/A   Occupational History  . Not on file.   Social History  Main Topics  . Smoking status: Never Smoker  . Smokeless tobacco: Not on file  . Alcohol use Yes     Comment: occasionally red wine  . Drug use: No  . Sexual activity: Not on file   Other Topics Concern  . Not on file   Social History Narrative  . No narrative on file   Review of Systems: A complete ROS was negative except as per HPI.   Review of Systems  Constitutional: Negative for chills, diaphoresis and fever.  HENT: Negative for ear pain and sinus pain.   Eyes: Negative for blurred vision, photophobia and redness.  Respiratory: Negative for cough and shortness of breath.   Cardiovascular: Negative for chest pain and leg swelling.  Gastrointestinal: Negative for constipation, diarrhea, nausea and vomiting.  Genitourinary: Negative for flank  pain and urgency.  Musculoskeletal: Negative for myalgias.  Neurological: Negative for dizziness and headaches.  Psychiatric/Behavioral: Negative for depression. The patient is not nervous/anxious.    Physical Exam: Blood pressure 126/76, pulse 68, temperature 99.1 F (37.3 C), resp. rate 14, height  (1.702 m), weight 220 lb (99.8 kg), SpO2 92 %. Physical Exam  Constitutional: He appears well-developed and well-nourished. No distress.  HENT:  Head: Normocephalic and atraumatic.  Eyes: Conjunctivae and EOM are normal.  Neck: Normal range of motion.  Cardiovascular: Normal rate and regular rhythm.   No murmur heard. Pulmonary/Chest: Effort normal and breath sounds normal. No stridor. No respiratory distress.  Abdominal: Soft. Bowel sounds are normal. He exhibits no distension.  Musculoskeletal: He exhibits no edema.  Neurological: He is alert.  Skin: Skin is warm. He is not diaphoretic.  Psychiatric: He has a normal mood and affect.    EKG: personally reviewed my interpretation is sinus rhythm.  CXR: personally reviewed my interpretation is N/A  Assessment & Plan by Problem: Active Problems:   TIA (transient ischemic attack)  Mr. Hurman Ketelsen 51 year old male who presents with rapid onset confusion and left arm numbness which quickly resolved. His past medical history is significant only for hyperlipidemia, anxiety, with no other risk factors contributing to increased risk for stroke other than family history. Also considered on the differential and still possible is panic attack. Differential included TIA, anxiety, migraine w/o aura, cardiac arrhythmia, focal seizure, and MI.   Transient ischemic attack: Neurology was consulted for evaluation of possible TIA -CT head w/o contrast, negative for acute process -MRI brain pending -Echocardiogram underway -INR and aPTT normal -CBC, CMP, troponins w/nml -HgbA1c, fasting lipid panel -PT/OT consult -Carotid dopplers -Telemetry  monitoring    Diet: NPO Fluids: DVT ppx: Code: Full Dispo: Admit patient to Observation with expected length of stay less than 2 midnights.  Signed: Lanelle Bal, MD 09/04/2017, 4:16 PM  Pager: Pager# [phone]

## 2017-09-04 NOTE — ED Provider Notes (Signed)
[facility]-EMER[facility]ENCY DEPT Provider Note   CSN: [id number] Arrival date & time: 09/04/17  1253     History   Chief Complaint Chief Complaint  Patient presents with  . Transient Ischemic Attack    HPI Alvin Howard is a 51 y.o. male.  Pt presents to the ED today with left arm numbness, speech difficulty, and confusion.  Sx started around 10:30.  Pt did take an ativan and sx have improved.  Code stroke called from triage.      Past Medical History:  Diagnosis Date  . Anxiety   . Dysrhythmia    rapid  . Family history of anesthesia complication    Mother- N/V  . GERD (gastroesophageal reflux disease)   . High cholesterol   . Internal hemorrhoid    per colonoscopy    Patient Active Problem List   Diagnosis Date Noted  . TIA (transient ischemic attack) 09/04/2017  . Gallbladder polyp 02/07/2013  . High cholesterol 01/05/2013  . GERD (gastroesophageal reflux disease) 01/05/2013  . Family hx of colon cancer 01/05/2013    Past Surgical History:  Procedure Laterality Date  . CHOLECYSTECTOMY N/A 02/14/2013   Procedure: LAPAROSCOPIC CHOLECYSTECTOMY WITH INTRAOPERATIVE CHOLANGIOGRAM;  Surgeon: Ernestene Mention, MD;  Location: [facility] OR;  Service: General;  Laterality: N/A;  . COLONOSCOPY    . COLONOSCOPY WITH ESOPHAGOGASTRODUODENOSCOPY (EGD)  01/20/2013   Procedure: COLONOSCOPY WITH ESOPHAGOGASTRODUODENOSCOPY (EGD);  Surgeon: Malissa Hippo, MD;  Location: [facility];  Service: Endoscopy;  Laterality: N/A;  100  . EXPLORATORY LAPAROTOMY     as a child  . NM MYOVIEW LTD  09/01/2012   normal       Home Medications    Prior to Admission medications   Medication Sig Start Date End Date Taking? Authorizing Provider  fish oil-omega-3 fatty acids 1000 MG capsule Take 2 g by mouth daily.    Yes [provider]  LORazepam (ATIVAN) 1 MG tablet Take 0.25 mg by mouth once.   Yes [provider]  Nutritional Supplements (JUICE PLUS FIBRE PO) Take 3 capsules by mouth  2 (two) times daily.   Yes [provider]  aspirin 81 MG chewable tablet Chew 81 mg by mouth daily.    [provider]  Coenzyme Q-10 200 MG CAPS Take 1 capsule by mouth daily.    [provider]  dexlansoprazole (DEXILANT) 60 MG capsule Take 60 mg by mouth daily.    [provider]  escitalopram (LEXAPRO) 20 MG tablet Take 10-20 mg by mouth daily.    [provider]  Garlic-Lecithin CAPS Take 1 capsule by mouth daily.    [provider]  LORazepam (ATIVAN) 0.5 MG tablet Take 0.5-1 mg by mouth 2 (two) times daily as needed for anxiety.     [provider]  metoprolol tartrate (LOPRESSOR) 25 MG tablet Take 25 mg by mouth daily.    [provider]  Multiple Vitamin (MULTIVITAMIN WITH MINERALS) TABS Take 1 tablet by mouth daily.    [provider]  Red Yeast Rice Extract (RED YEAST RICE PO) Take 2 tablets by mouth at bedtime.     [provider]  simvastatin (ZOCOR) 20 MG tablet Take 1 tablet (20 mg total) by mouth daily. 12/28/13   Lennette Bihari, MD    Family History Family History  Problem Relation Age of Onset  . Colon cancer Mother   . Cancer Mother        colon, ovarian  . Cancer  Father        lung    Social History Social History  Substance Use Topics  . Smoking status: Never Smoker  . Smokeless tobacco: Not on file  . Alcohol use Yes     Comment: occasionally red wine     Allergies   Patient has no known allergies.   Review of Systems Review of Systems  Neurological: Positive for speech difficulty and numbness.  Psychiatric/Behavioral: The patient is nervous/anxious.   All other systems reviewed and are negative.    Physical Exam Updated Vital Signs BP 126/76   Pulse 68   Temp 99.1 F (37.3 C)   Resp 14   Ht  (1.702 m)   Wt 99.8 kg (220 lb)   SpO2 92%   BMI 34.46 kg/m   Physical Exam  Constitutional: He is oriented to person, place, and time. He appears  well-developed and well-nourished.  HENT:  Head: Normocephalic and atraumatic.  Right Ear: External ear normal.  Left Ear: External ear normal.  Nose: Nose normal.  Mouth/Throat: Oropharynx is clear and moist.  Eyes: Pupils are equal, round, and reactive to light. Conjunctivae and EOM are normal.  Neck: Normal range of motion. Neck supple.  Cardiovascular: Normal rate, regular rhythm, normal heart sounds and intact distal pulses.   Pulmonary/Chest: Effort normal and breath sounds normal.  Abdominal: Soft. Bowel sounds are normal.  Musculoskeletal: Normal range of motion.  Neurological: He is alert and oriented to person, place, and time.  Skin: Skin is warm.  Psychiatric: He has a normal mood and affect. His behavior is normal. Judgment and thought content normal.  Nursing note and vitals reviewed.    ED Treatments / Results  Labs (all labs ordered are listed, but only abnormal results are displayed) Labs Reviewed  COMPREHENSIVE METABOLIC PANEL - Abnormal; Notable for the following:       Result Value   Glucose, Bld 103 (*)    Anion gap 3 (*)    All other components within normal limits  CBG MONITORING, ED - Abnormal; Notable for the following:    Glucose-Capillary 101 (*)    All other components within normal limits  I-STAT CHEM 8, ED - Abnormal; Notable for the following:    Glucose, Bld 100 (*)    All other components within normal limits  PROTIME-INR  APTT  CBC  DIFFERENTIAL  HIV ANTIBODY (ROUTINE TESTING)  CREATININE, SERUM  I-STAT TROPONIN, ED    EKG  EKG Interpretation  Date/Time:  Friday September 04 2017 13:25:25 EDT Ventricular Rate:  75 PR Interval:    QRS Duration: 94 QT Interval:  377 QTC Calculation: 421 R Axis:   70 Text Interpretation:  Sinus rhythm Confirmed by Jacalyn Lefevre [phone]) on 09/04/2017 1:32:25 PM       Radiology Ct Head Code Stroke Wo Contrast  Result Date: 09/04/2017 CLINICAL DATA:  Code stroke. 51 year old male with  confusion and left side weakness. EXAM: CT HEAD WITHOUT CONTRAST TECHNIQUE: Contiguous axial images were obtained from the base of the skull through the vertex without intravenous contrast. COMPARISON:  None. FINDINGS: Brain: No midline shift, ventriculomegaly, mass effect, evidence of mass lesion, intracranial hemorrhage or evidence of cortically based acute infarction. Gray-white matter differentiation is within normal limits throughout the brain. Vascular: No suspicious intracranial vascular hyperdensity. Skull: Negative.  No acute osseous abnormality identified. Sinuses/Orbits: Clear. Other: Visualized orbits and scalp soft tissues are within normal limits. ASPECTS [facility] Stroke Program Early CT Score) - Ganglionic level infarction (  caudate, lentiform nuclei, internal capsule, insula, M1-M3 cortex): 7 - Supraganglionic infarction (M4-M6 cortex): 3 Total score (0-10 with 10 being normal): 10 IMPRESSION: 1. Normal noncontrast CT appearance of the brain. 2. ASPECTS is 10. 3. The above was relayed via text pager to Dr. Agnes Lawrence on 09/04/2017 at 13:14 . Electronically Signed   By: Odessa Fleming M.D.   On: 09/04/2017 13:14    Procedures Procedures (including critical care time)  Medications Ordered in ED Medications  aspirin chewable tablet 81 mg (not administered)  enoxaparin (LOVENOX) injection 40 mg (not administered)  acetaminophen (TYLENOL) tablet 650 mg (not administered)    Or  acetaminophen (TYLENOL) suppository 650 mg (not administered)  ondansetron (ZOFRAN) tablet 4 mg (not administered)    Or  ondansetron (ZOFRAN) injection 4 mg (not administered)  polyethylene glycol (MIRALAX / GLYCOLAX) packet 17 g (not administered)  aspirin chewable tablet 324 mg (324 mg Oral Given 09/04/17 1419)     Initial Impression / Assessment and Plan / ED Course  I have reviewed the triage vital signs and the nursing notes.  Pertinent labs & imaging results that were available during my care of the patient  were reviewed by me and considered in my medical decision making (see chart for details).  Pt d/w Dr. Otelia Limes (neurology) who saw pt in ED for code stroke.  Pt's sx are not severe enough to require tpa.  He recommended admission for stroke work up.   Pt d/w hospitalists who said pt is unassigned.  I spoke with IM residents who will admit for observation and stroke work up.    Final Clinical Impressions(s) / ED Diagnoses   Final diagnoses:  Transient cerebral ischemia, unspecified type    New Prescriptions New Prescriptions   No medications on file     Jacalyn Lefevre, MD 09/04/17 1536

## 2017-09-04 NOTE — ED Notes (Signed)
[facility] contacted to activate Code Stroke 

## 2017-09-04 NOTE — ED Notes (Signed)
Pt transported to echo 

## 2017-09-05 ENCOUNTER — Encounter (HOSPITAL_COMMUNITY): Payer: Self-pay

## 2017-09-05 ENCOUNTER — Observation Stay (HOSPITAL_BASED_OUTPATIENT_CLINIC_OR_DEPARTMENT_OTHER): Payer: BLUE CROSS/BLUE SHIELD

## 2017-09-05 DIAGNOSIS — G458 Other transient cerebral ischemic attacks and related syndromes: Secondary | ICD-10-CM

## 2017-09-05 DIAGNOSIS — F411 Generalized anxiety disorder: Secondary | ICD-10-CM

## 2017-09-05 LAB — GLUCOSE, CAPILLARY: Glucose-Capillary: 111 mg/dL — ABNORMAL HIGH (ref 65–99)

## 2017-09-05 LAB — LIPID PANEL
CHOLESTEROL: 238 mg/dL — AB (ref 0–200)
HDL: 43 mg/dL (ref >40)
LDL CALC: 149 mg/dL — AB (ref 0–99)
Total CHOL/HDL Ratio: 5.5 RATIO
Triglycerides: 231 mg/dL — ABNORMAL HIGH (ref <150)
VLDL: 46 mg/dL — AB (ref 0–40)

## 2017-09-05 LAB — VAS US CAROTID
LCCAPDIAS: 33 cm/s
LCCAPSYS: 145 cm/s
LEFT ECA DIAS: -14 cm/s
LEFT VERTEBRAL DIAS: -16 cm/s
LICADDIAS: -27 cm/s
LICAPSYS: -67 cm/s
Left CCA dist dias: -22 cm/s
Left CCA dist sys: -76 cm/s
Left ICA dist sys: -60 cm/s
Left ICA prox dias: -30 cm/s
RCCAPDIAS: 23 cm/s
RCCAPSYS: 125 cm/s
RIGHT ECA DIAS: -13 cm/s
RIGHT VERTEBRAL DIAS: 15 cm/s
Right cca dist sys: -62 cm/s

## 2017-09-05 LAB — HIV ANTIBODY (ROUTINE TESTING W REFLEX): HIV Screen 4th Generation wRfx: NONREACTIVE

## 2017-09-05 LAB — HEMOGLOBIN A1C
Hgb A1c MFr Bld: 5.4 % (ref 4.8–5.6)
Mean Plasma Glucose: 108.28 mg/dL

## 2017-09-05 MED ORDER — LORAZEPAM 0.5 MG PO TABS: 0.2500 mg | ORAL_TABLET | Freq: Two times a day (BID) | ORAL | 0 refills | Status: AC

## 2017-09-05 MED ORDER — ATORVASTATIN CALCIUM 40 MG PO TABS: 40.0000 mg | ORAL_TABLET | Freq: Every day | ORAL | 11 refills | Status: DC

## 2017-09-05 NOTE — Progress Notes (Signed)
*  PRELIMINARY RESULTS* Vascular Ultrasound Carotid Duplex (Doppler) has been completed.  Preliminary findings: Bilateral 1-39% ICA stenosis, antegrade vertebral flow.   Chauncey Fischer 09/05/2017, 11:52 AM

## 2017-09-05 NOTE — Discharge Summary (Signed)
Name: Alvin Howard MRN: [id number] DOB: [birth year] 51 y.o. PCP: Assunta Found, MD  Date of Admission: 09/04/2017 12:56 PM Date of Discharge: 09/05/2017 Attending Physician: Gust Rung, DO  Discharge Diagnosis: Active Problems:   High cholesterol   TIA (transient ischemic attack)   Generalized anxiety disorder  Discharge Medications: Allergies as of 09/05/2017   No Known Allergies     Medication List    STOP taking these medications   JUICE PLUS FIBRE PO   metoprolol tartrate 25 MG tablet Commonly known as:  LOPRESSOR   simvastatin 20 MG tablet Commonly known as:  ZOCOR     TAKE these medications   aspirin 81 MG chewable tablet Chew 81 mg by mouth daily.   atorvastatin 40 MG tablet Commonly known as:  LIPITOR Take 1 tablet (40 mg total) by mouth daily.   Coenzyme Q-10 200 MG Caps Take 1 capsule by mouth daily.   dexlansoprazole 60 MG capsule Commonly known as:  DEXILANT Take 60 mg by mouth daily.   escitalopram 20 MG tablet Commonly known as:  LEXAPRO Take 10-20 mg by mouth daily.   fish oil-omega-3 fatty acids 1000 MG capsule Take 2 g by mouth daily.   Garlic-Lecithin Caps Take 1 capsule by mouth daily.   LORazepam 0.5 MG tablet Commonly known as:  ATIVAN Take 0.5 tablets (0.25 mg total) by mouth 2 (two) times daily. What changed:  how much to take  when to take this  reasons to take this  Another medication with the same name was removed. Continue taking this medication, and follow the directions you see here.   multivitamin with minerals Tabs tablet Take 1 tablet by mouth daily.   RED YEAST RICE PO Take 2 tablets by mouth at bedtime.            Discharge Care Instructions        Start     Ordered   09/05/17 0000  LORazepam (ATIVAN) 0.5 MG tablet  2 times daily     09/05/17 1219   09/05/17 0000  atorvastatin (LIPITOR) 40 MG tablet  Daily     09/05/17 1219   09/05/17 0000  Increase activity slowly     09/05/17 1219   09/05/17 0000  Diet - low sodium heart healthy     09/05/17 1219   09/05/17 0000  Call MD for:  persistant dizziness or light-headedness     09/05/17 1219   09/05/17 0000  Call MD for:  extreme fatigue     09/05/17 1219      Disposition and follow-up:   Alvin Howard was discharged from [facility] in Good condition.  At the hospital follow up visit please address:  1.  Anxiety      Hyperlipidemia  2.  Labs / imaging needed at time of follow-up: n/a  3.  Pending labs/ test needing follow-up: n/a  Follow-up Appointments: Follow-up Information    Assunta Found, MD Follow up in 1 week(s).   Specialty:  Family Medicine Contact information: [address] [place] [address] [phone]           Hospital Course by problem list: Active Problems:   High cholesterol   TIA (transient ischemic attack)   Generalized anxiety disorder   1. TIA vs GAD with acute episode  Alvin Howard is a 51 year old male who presented to the [facility] ED following a brief episode of vision changes, slurred speech, and tingling in his left hand.  This episode was brief in nature, resolved rapidly with the administration of 0.25 mg dose of Ativan, and had been at almost completely resolved upon arrival to the ED. Evaluation with carotid Dopplers, echocardiogram, troponins, CBC, CMP, MRI of the brain, CT of the brain, were all unremarkable. Given the definition of TIA, however, such an event cannot be ruled out. This episode was contributed most likely to generalized anxiety disorder with acute episode secondary to multiple increased stressors in the recent months. 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 He has been experiencing multiple life stressors of recent has had similar but not identical episodes in the past following the death of his mother-in-law in 02-Dec-2012. At that time he was given a  prescription for Ativan 1 mg tablets,which he has taken approximately 0.25 mg doses for a total of 1-2 tablets yearly since that time. Lipid panel was ordered indicating elevated LDL. The patient was advised to continue taking aspirin 81 mg daily, atorvastatin 40 mg daily.  In addition it was explained that the lack of evidence of an acute process on MRI did not decrease the possibility the a TIA occurred. It was reiterated that the patient should remain on the aspirin and to strongly consider a statin to decrease his risk of additional events. The patient is highly resistant to the idea of medicating with statins as his father experienced significant myositis of the proximal muscles with such medication. In addition the patient was advised to seek his PCPs evaluation of this event as well for any recommendations he may have. Patient agreed to see his PCP at his next available appointment and to take the aspirin daily. Patient stated that he would agree to the aspirin and was strongly considering the statin at this time. Patient was further informed of the results of his Doppler, MRI, echo, and labs, who requested a copy of his labs. A printout was provided to the patient of his lab values, and he was informed that he could use the my chart at to obtain the remaining results. The patient requested a prescription for Ativan, when necessary, for extreme anxiety, which was provided.  The patient was advised to return to the ED for any time to experience the symptoms again, similar or slightly more severe symptoms, or more prolonged loss of use, or weakness, loss of sensation of any one side of his body or both. The patient was advised to call his PCP or return to the ED for any unusual concerning symptoms.  Discharge Vitals:   BP 138/84 (BP Location: Left Arm)   Pulse 72   Temp 98.6 F (37 C) (Oral)   Resp 18   Ht  (1.702 m)   Wt 220 lb (99.8 kg)   SpO2 97%   BMI 34.46 kg/m   Pertinent Labs,  Studies, and Procedures:   MRI Brain wo contrast: IMPRESSION: Normal MRI/MRA of the brain.  MRA head wo contrast: IMPRESSION: Normal MRI/MRA of the brain.  ECHOCARDIOGRAM: LV EF: 60% -   65%  ------------------------------------------------------------------- History:   PMH:  TIA.  Risk factors:  Dyslipidemia.  ------------------------------------------------------------------- Study Conclusions  - Left ventricle: The cavity size was normal. There was mild   concentric hypertrophy. Systolic function was normal. The   estimated ejection fraction was in the range of 60% to 65%. Wall   motion was normal; there were no regional wall motion   abnormalities. Left ventricular diastolic function  parameters   were normal. - Aortic valve: Transvalvular velocity was within the normal range.   There was no stenosis. There was no regurgitation. - Mitral valve: Transvalvular velocity was within the normal range.   There was no evidence for stenosis. There was trivial   regurgitation. - Right ventricle: The cavity size was normal. Wall thickness was   normal. - Atrial septum: No defect or patent foramen ovale was identified. - Tricuspid valve: There was no regurgitation.   Carotid U/S: Summary:  - The vertebral arteries appear patent with antegrade flow. - Findings consistent with a 1-39 percent stenosis involving the   right internal carotid artery and the left internal carotid   artery. High bifurcation bilaterally.  Lipid Panel     Component Value Date/Time   CHOL 238 (H) 09/05/2017 0504   TRIG 231 (H) 09/05/2017 0504   HDL 43 09/05/2017 0504   CHOLHDL 5.5 09/05/2017 0504   VLDL 46 (H) 09/05/2017 0504   LDLCALC 149 (H) 09/05/2017 0504   CMP Latest Ref Rng & Units 09/04/2017 09/04/2017 02/10/2013  Glucose 65 - 99 mg/dL 409(W) 119(J) 97  BUN 6 - 20 mg/dL Creatinine 0.61 - 1.24 mg/dL 4.78 2.95 6.21  Sodium 135 - 145 mmol/L 141 139 141  Potassium 3.5 - 5.1 mmol/L  3.8 3.9 3.9  Chloride 101 - 111 mmol/L 105 108 104  CO2 22 - 32 mmol/L - 28 29  Calcium 8.9 - 10.3 mg/dL - 9.0 9.5  Total Protein 6.5 - 8.1 g/dL - 6.7 7.0  Total Bilirubin 0.3 - 1.2 mg/dL - 0.6 0.4  Alkaline Phos 38 - 126 U/L - 51 53  AST 15 - 41 U/L - 32 16  ALT 17 - 63 U/L - 47 16   CBC Latest Ref Rng & Units 09/04/2017 09/04/2017 02/10/2013  WBC 4.0 - 10.5 K/uL - 4.0 4.8  Hemoglobin 13.0 - 17.0 g/dL 30.8 65.7 84.6  Hematocrit 39.0 - 52.0 % 42.0 40.9 40.5  Platelets 150 - 400 K/uL - 191 242      Discharge Instructions: Discharge Instructions    Call MD for:  extreme fatigue    Complete by:  As directed    Call MD for:  persistant dizziness or light-headedness    Complete by:  As directed    Diet - low sodium heart healthy    Complete by:  As directed    Increase activity slowly    Complete by:  As directed       Signed: Lanelle Bal, MD 09/05/2017, 12:45 PM   Pager: Pager# [phone]

## 2017-09-05 NOTE — Progress Notes (Signed)
Subjective: A considerable amount time was spent discussing patient's care as he is uncertain to the efficacy and safety of many medications including statins. Patient states that his father experienced negative side effects proximal muscle weakness and pain most likely associated with his statin use. As such he would like to consider the use of statins in the future. Dr. Mikey Bussing informed the patient that the active ingredient in statins had originally been extracted from red yeast and that there was significant mortality benefit associated with the use of statins but is not observed in the typical diet, exercise, nutritional supplementation method. Patient agreed to consider this and was in agreement with pain discharged home with his prescription for atorvastatin 40 mg daily that he could feel if he so desired.   The patient denied visual changes, headache, chest pain, bone pain, muscle pain, fever, chills, numbness or tingling in his hands, unilateral weakness, or other concerning symptom.  Patient stated that he would like to know when he can be discharged home. Patient was informed upon the completion of the carotid Doppler that he can be released. Patient was advised that thus far his MRI, echo, labs, had failed to reveal any pathology other than hyperlipidemia. Patient was advised that he should remain on aspirin, and a high intensity statin for the remainder of his life. In addition it was xplained that the lack of evidence of an acute process on MRI did not decreased the possibility the a TIA occurred. It was reiterated that the patient  should remain on the aspirin and to strongly consider a statin to decrease his risk of additional events. In addition the patient was advised to seek his PCPs evaluation of this event as well for any recommendations he may have. Patient agreed to see his PCP because that's available appointment and to take the aspirin daily. He agreed to consider using a  statin.  The patient was again visited but the junior resident who informed the patient that he should strongly consider taking the atorvastatin 40 mg daily and that he should take the aspirin. Patient stated that he would agree to the aspirin and was strongly considering the statin at this time. Patient was further informed of the results of his Doppler, MRI, echo, and labs, who requested a copy of his labs. A printout was provided to the patient of his lab values, and he was informed that he could use the my chart at to obtain the remaining results. The patient requested a prescription for Ativan, when necessary, for extreme anxiety, which was provided.  Objective:  Vital signs in last 24 hours: Vitals:   09/05/17 0240 09/05/17 0440 09/05/17 0640 09/05/17 0953  BP: 100/68 107/67 119/79 138/84  Pulse: (!) 57 67 (!) 54 72  Resp: Temp:   98.3 F (36.8 C) 98.6 F (37 C)  TempSrc:   Oral Oral  SpO2: 93% 95% 94% 97%  Weight:      Height:       ROS negative except as per HPI.  Physical Exam  Constitutional: He appears well-developed and well-nourished. No distress.  HENT:  Head: Normocephalic and atraumatic.  Eyes: Conjunctivae and EOM are normal.  Cardiovascular: Normal rate and regular rhythm.   No murmur heard. Pulmonary/Chest: Effort normal and breath sounds normal. No respiratory distress.  Abdominal: Soft. Bowel sounds are normal. He exhibits no distension.  Musculoskeletal: He exhibits no edema.  Neurological: He is alert.  Skin: Skin is warm. He is  not diaphoretic.  Psychiatric: He has a normal mood and affect.  Vitals reviewed.  Assessment/Plan:  Active Problems:   High cholesterol   TIA (transient ischemic attack)   Generalized anxiety disorder  Transient ischemic attack: Neurology was consulted for evaluation of possible TIA -CT head w/o contrast, negative for acute process -MRI brain -negative for acute process or stroke -Echocardiogram  unremarkable, normal study, EF 60-65% -INR and aPTT normal -CBC, CMP, troponins w/nml -HgbA1c, - fasting lipid panel abnormal, patient to have increased dose of statin prescribed as outpatient -PT/OT consult not indicated -Carotid dopplers preformed with results indicating 1-39% stenosis, unremarkable study -Telemetry monitoring failed to demonstrate irregularities  -Patient to be discharged home today  GAD: Patient has a history of anxiety disorder -Patient taken no more than a total of 41 mg tablets of Ativan over the last 4 years -patient is a multiple stressful life events which likely contributed to his presentation  Diet: NPO Fluids: DVT ppx: Code: Full Dispo: Anticipated discharge in approximately today.   Lanelle Bal, MD 09/05/2017, 3:58 PM Pager: Pager# [phone]

## 2017-09-05 NOTE — Progress Notes (Signed)
STROKE TEAM PROGRESS NOTE   HISTORY OF PRESENT ILLNESS (per record) Alvin Howard is an 51 y.o. male who presented to the ED for evaluation of acute onset confusion with left arm sensory numbness and "trouble getting the words out". His symptoms improved somewhat after he took part of an Ativan pill. He has had a prior episode in the past with similar symptoms, also relieved with Ativan; at that time he had been under stress due to a death in the family. He states that he recently has also been under some stress. 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   ASA is listed as one of his home medications, but he does not take this regularly.   LSN: 1000 tPA Given: No: NIHSS = 0   SUBJECTIVE (INTERVAL HISTORY) Dr Pearlean Brownie spoke with both the patient and his wife. The patient's presenting symptoms as well as risk factor modification were discussed.   OBJECTIVE Temp:  [98.2 F (36.8 C)-98.6 F (37 C)] 98.6 F (37 C) (09/15 0953) Pulse Rate:  [54-72] 72 (09/15 0953) Cardiac Rhythm: Sinus bradycardia (09/15 0700) Resp:  [16-18] 18 (09/15 0953) BP: (100-154)/(62-91) 138/84 (09/15 0953) SpO2:  [92 %-99 %] 97 % (09/15 0953)  CBC:   Recent Labs Lab 09/04/17 1316 09/04/17 1325  WBC 4.0  --   NEUTROABS 2.2  --   HGB 14.1 14.3  HCT 40.9 42.0  MCV 83.8  --   PLT 191  --     Basic Metabolic Panel:   Recent Labs Lab 09/04/17 1316 09/04/17 1325  NA 139 141  K 3.9 3.8  CL 108 105  CO2 28  --   GLUCOSE 103* 100*  BUN 11 14  CREATININE 0.84 0.80  CALCIUM 9.0  --     Lipid Panel:     Component Value Date/Time   CHOL 238 (H) 09/05/2017 0504   TRIG 231 (H) 09/05/2017 0504   HDL 43 09/05/2017 0504   CHOLHDL 5.5 09/05/2017 0504   VLDL 46 (H) 09/05/2017 0504   LDLCALC 149 (H) 09/05/2017 0504   HgbA1c:  Lab Results  Component Value Date   HGBA1C 5.4 09/05/2017   Urine Drug Screen: No results found for: LABOPIA,  COCAINSCRNUR, LABBENZ, AMPHETMU, THCU, LABBARB  Alcohol Level No results found for: [facility]  IMAGING  Mr Maxine Glenn Head Wo Contrast 09/04/2017 IMPRESSION:  Normal MRI/MRA of the brain.    Ct Head Code Stroke Wo Contrast  09/04/2017 IMPRESSION:  1. Normal noncontrast CT appearance of the brain.  2. ASPECTS is 10.   Transthoracic Echocardiogram  09/04/2017 Study Conclusions - Left ventricle: The cavity size was normal. There was mild   concentric hypertrophy. Systolic function was normal. The   estimated ejection fraction was in the range of 60% to 65%. Wall   motion was normal; there were no regional wall motion   abnormalities. Left ventricular diastolic function parameters   were normal. - Aortic valve: Transvalvular velocity was within the normal range.   There was no stenosis. There was no regurgitation. - Mitral valve: Transvalvular velocity was within the normal range.   There was no evidence for stenosis. There was trivial   regurgitation. - Right ventricle: The cavity size was normal. Wall thickness was   normal. - Atrial septum: No defect or patent foramen ovale was identified. - Tricuspid valve: There was no regurgitation.  Bilateral Carotid Dopplers  09/05/2017 Carotid Duplex (Doppler) has been  completed.  Preliminary findings: Bilateral 1-39% ICA stenosis, antegrade vertebral flow.  PHYSICAL EXAM  Vitals:   09/05/17 0240 09/05/17 0440 09/05/17 0640 09/05/17 0953  BP: 100/68 107/67 119/79 138/84  Pulse: (!) 57 67 (!) 54 72  Resp: Temp:   98.3 F (36.8 C) 98.6 F (37 C)  TempSrc:   Oral Oral  SpO2: 93% 95% 94% 97%  Weight:      Height:       Pleasant obese middle aged Caucasian male not in distress.  . Afebrile. Head is nontraumatic. Neck is supple without bruit.    Cardiac exam no murmur or gallop. Lungs are clear to auscultation. Distal pulses are well felt.  Neurological Exam ;  Awake  Alert oriented x 3. Normal speech and language.eye movements  full without nystagmus.fundi were not visualized. Vision acuity and fields appear normal. Hearing is normal. Palatal movements are normal. Face symmetric. Tongue midline. Normal strength, tone, reflexes and coordination. Normal sensation. Gait deferred.  ASSESSMENT/PLAN Alvin Howard is a 51 y.o. male with history of hyperlipidemia, this dysthymia, and anxiety presenting with confusion, speech difficulties and left arm numbness. He did not receive IV t-PA due to minimal deficits.  Possible TIA versus anxiety:   Resultant  No deficits  CT head - normal  MRI head - normal  MRA head - normal  Carotid Doppler Bilateral 1-39% ICA stenosis, antegrade vertebral flow.  2D Echo - EF 60-65%. No cardiac source of emboli identified.  LDL - 149  HgbA1c - 5.4  VTE prophylaxis - Lovenox Diet Heart Room service appropriate? Yes; Fluid consistency: Thin Diet - low sodium heart healthy  aspirin 81 mg daily prior to admission, now on aspirin 81 mg daily  Patient counseled to be compliant with his antithrombotic medications  Ongoing aggressive stroke risk factor management  Therapy recommendations: No follow-up therapy recommended  Disposition: The patient was discharged to home in stable and improved condition.  Hypertension  Stable  Permissive hypertension (OK if < 220/120) but gradually normalize in 5-7 days  Long-term BP goal normotensive  Hyperlipidemia  Home meds: No lipid lowering medications prior to admission  LDL 149, goal < 70  Zocor 20 mg daily added  Continue statin at discharge   Other Stroke Risk Factors  ETOH use, advised to drink no more than 1 - 2 drink(s) a day  Obesity, Body mass index is 34.46 kg/m., recommend weight loss, diet and exercise as appropriate    Other Active Problems  Anxiety  Hospital day # 0  Lauren Aguayo PA-C [facility] Pager [phone] 09/05/2017, 4:15 PM I have personally examined this patient, reviewed  notes, independently viewed imaging studies, participated in medical decision making and plan of care.ROS completed by me personally and pertinent positives fully documented  I have made any additions or clarifications directly to the above note. Agree with note above. He presented with transient speech difficulty and left hand tingling but brain imaging is negative for acute infarct. Unclear as to whether this is right hemispheric TIA from small vessel disease versus anxiety and he felt better after he took Ativan. Recommend aspirin 81 mg and aggressive risk factor modification. Greater than 50% time during this 25 minute visit was spent on counseling and coordination of care about TIA and stroke risk assessment, prevention and treatment discussion and answering questions.  Delia Heady, MD Medical Director [facility] Pager: [phone] 09/05/2017 5:40 PM   To contact Stroke  Continuity provider, please refer to [email or web address] After hours, contact General Neurology

## 2017-09-05 NOTE — Discharge Instructions (Signed)
Please call your PCP to schedule an appointment in the next 2 weeks. It is essential that you schedule his appointment for follow-up for possible TIA and anxiety.  You have been given a prescription for atorvastatin 40 mg daily, if you choose to fill this please take as directed. In addition, if you choose to fill this please discontinue the red yeast.  You have been given a new prescription for Ativan but at a lower dose. You have been given thirty 0.5 mg tablets to take 0.25 mg as needed.  If your symptoms worsen or recur, or you were to develop additional concerning symptoms please return to the emergency department. Symptoms such as confusion, slurred speech,loss of strength or sensation in either side of the body, visual changes, or other concerning symptoms should be monitored.  Thank you for your visit to [facility].

## 2017-09-05 NOTE — Progress Notes (Signed)
Patient discharge instructions explain, reviewed and patient and patient wife v/u. All follow-up instructions, appointments, current medications and medication changes reviewed and patient v/u. PIV removed and patient belongings returned. CCMD notified of patient discharge and cardiac monitor removed.

## 2018-02-16 ENCOUNTER — Encounter (INDEPENDENT_AMBULATORY_CARE_PROVIDER_SITE_OTHER): Payer: Self-pay | Admitting: *Deleted

## 2018-06-17 DIAGNOSIS — T148XXA Other injury of unspecified body region, initial encounter: Secondary | ICD-10-CM | POA: Diagnosis not present

## 2018-06-17 DIAGNOSIS — L608 Other nail disorders: Secondary | ICD-10-CM | POA: Diagnosis not present

## 2018-07-08 DIAGNOSIS — N430 Encysted hydrocele: Secondary | ICD-10-CM | POA: Diagnosis not present

## 2018-07-12 ENCOUNTER — Other Ambulatory Visit: Payer: Self-pay | Admitting: Urology

## 2018-08-03 DIAGNOSIS — H43391 Other vitreous opacities, right eye: Secondary | ICD-10-CM | POA: Diagnosis not present

## 2018-08-24 DIAGNOSIS — N430 Encysted hydrocele: Secondary | ICD-10-CM | POA: Diagnosis not present

## 2018-08-30 ENCOUNTER — Encounter (HOSPITAL_BASED_OUTPATIENT_CLINIC_OR_DEPARTMENT_OTHER): Payer: Self-pay

## 2018-09-02 ENCOUNTER — Encounter (HOSPITAL_BASED_OUTPATIENT_CLINIC_OR_DEPARTMENT_OTHER): Payer: Self-pay

## 2018-09-02 ENCOUNTER — Other Ambulatory Visit: Payer: Self-pay

## 2018-09-02 NOTE — Progress Notes (Signed)
Spoke with:  Onalee Huaavid NPO:  After Midnight, no gum, candy, or mints   Arrival time: 0530AM Labs: N/A AM medications:  None Pre op orders:  Yes Ride home:  Juliette AlcideMelinda (wife) [phone]

## 2018-09-06 NOTE — H&P (Signed)
CC: I have swelling in my scrotum.  HPI: Alvin Howard is a 52 year-old male established patient who is here for scrotal swelling.  07/19: Alvin HuaDavid returns today in f/u. He was seen in 10/16 for a left hydrocele and it is getting larger and more bothersome. He has no pain or other associated signs or symptoms.    08/24/18: Returns today for preoperative appointment prior to undergoing left hydrocelectomy on 9/17 with his urologist, Dr. Annabell HowellsWrenn. Since last office visit, he denies any changes in past medical history or new medications. He's had no interval evaluation for or treatment of an infection. Voiding symptoms at his baseline. He states the hydrocele has not increased in size or is he having any pain/discomfort.   His hydrocele is on the left side. He does not have pain on the side of his hydrocele.   His hydrocele does bother him enough to consider surgical repair. He has not had a testicular infection.     ALLERGIES: No Allergies    MEDICATIONS: Juice Plus     GU PSH: None     PSH Notes: Cholecystectomy   NON-GU PSH: Cholecystectomy (open) - 2016    GU PMH: Hydrocele (Worsening), Left, His hydrocele has grown and is symptomatic. I am going to get him set up for a hydrocelectomy and reviewed the risks of bleeding, hematoma, infection, testicular injury, recurrent hydrocele, thrombotic events and anesthetic complications. - 07/08/2018 Hydrocele, Unspec, Left hydrocele - 2016    NON-GU PMH: Anxiety, Anxiety - 2016 Encounter for general adult medical examination without abnormal findings, Encounter for preventive health examination - 2016 Personal history of other diseases of the digestive system, History of esophageal reflux - 2016    FAMILY HISTORY: 1 Daughter - No Family History 1 son - No Family History Diabetes - Runs In Family malignant neoplasm - Runs In Family Myocardial Infarction - Runs In Family   SOCIAL HISTORY: Marital Status: Married Preferred Language: English;  Race: White Current Smoking Status: Patient has never smoked.   Tobacco Use Assessment Completed: Used Tobacco in last 30 days? Drinks 1 drink per day. Types of alcohol consumed: Beer.  Does not use drugs. Drinks 1 caffeinated drink per day. Has not had a blood transfusion.     Notes: Occupation, Caffeine use, Never a smoker, Alcohol use, Number of children   REVIEW OF SYSTEMS:    GU Review Male:   Patient denies frequent urination, hard to postpone urination, burning/ pain with urination, get up at night to urinate, leakage of urine, stream starts and stops, trouble starting your stream, have to strain to urinate , erection problems, and penile pain.  Gastrointestinal (Upper):   Patient denies nausea, vomiting, and indigestion/ heartburn.  Gastrointestinal (Lower):   Patient denies diarrhea and constipation.  Constitutional:   Patient denies fever, night sweats, weight loss, and fatigue.  Skin:   Patient denies skin rash/ lesion and itching.  Eyes:   Patient denies blurred vision and double vision.  Ears/ Nose/ Throat:   Patient denies sinus problems and sore throat.  Hematologic/Lymphatic:   Patient denies swollen glands and easy bruising.  Cardiovascular:   Patient denies leg swelling and chest pains.  Respiratory:   Patient denies cough and shortness of breath.  Endocrine:   Patient denies excessive thirst.  Musculoskeletal:   Patient denies back pain and joint pain.  Neurological:   Patient denies headaches and dizziness.  Psychologic:   Patient denies depression and anxiety.   VITAL SIGNS:  08/24/2018 01:26 PM  Weight 220 lb / 99.79 kg  Height 67 in / 170.18 cm  BP 167/97 mmHg  Pulse 67 /min  Temperature 97.9 F / 36.6 C  BMI 34.5 kg/m   MULTI-SYSTEM PHYSICAL EXAMINATION:    Constitutional: Well-nourished. No physical deformities. Normally developed. Good grooming.  Neck: Neck symmetrical, not swollen. Normal tracheal position.  Respiratory: No labored breathing, no  use of accessory muscles. Normal breath sounds.  Cardiovascular: Regular rate and rhythm. No murmur, no gallop. , no swelling, no varicosities.   Skin: No paleness, no jaundice, no cyanosis. No lesion, no ulcer, no rash.  Neurologic / Psychiatric: Oriented to time, oriented to place, oriented to person. No depression, no anxiety, no agitation.  Gastrointestinal: Obese abdomen. No mass, no tenderness, no rigidity.   Musculoskeletal: Normal gait and station of head and neck.     PAST DATA REVIEWED:  Source Of History:  Patient  Records Review:   Previous Patient Records  Urine Test Review:   Urinalysis   08/24/18  Urinalysis  Urine Appearance Clear   Urine Color Yellow   Urine Glucose Neg mg/dL  Urine Bilirubin Neg mg/dL  Urine Ketones Neg mg/dL  Urine Specific Gravity 1.020   Urine Blood Neg ery/uL  Urine pH 5.5   Urine Protein Neg mg/dL  Urine Urobilinogen 0.2 mg/dL  Urine Nitrites Neg   Urine Leukocyte Esterase Neg leu/uL   PROCEDURES:          Urinalysis Dipstick Dipstick Cont'd  Color: Yellow Bilirubin: Neg mg/dL  Appearance: Clear Ketones: Neg mg/dL  Specific Gravity: 0.454 Blood: Neg ery/uL  pH: 5.5 Protein: Neg mg/dL  Glucose: Neg mg/dL Urobilinogen: 0.2 mg/dL    Nitrites: Neg    Leukocyte Esterase: Neg leu/uL    ASSESSMENT:      ICD-10 Details  1 GU:   Hydrocele - N43.0 Left   PLAN:           Orders Labs Urine Culture          Schedule Return Visit/Planned Activity: Keep Scheduled Appointment - Schedule Surgery          Document Letter(s):  Created for Patient: Clinical Summary         Notes:   All questions answered to the best of my ability about upcoming procedure and immediate postoperative period with understanding expressed by the patient and his wife. Preprocedural baseline urine culture sent today. Instructed to telephone with any changes in symptoms, questions about upcoming procedure or other concerns. He'll proceed with left-sided  hydrocelectomy on 9/17.        Next Appointment:      Next Appointment: 09/07/2018 07:30 AM    Appointment Type: Surgery     Location: [facility] [phone]    Provider: Bjorn Pippin, M.D.    Reason for Visit: OP NE LT HYDROCELECTOMY

## 2018-09-07 ENCOUNTER — Other Ambulatory Visit: Payer: Self-pay

## 2018-09-07 ENCOUNTER — Ambulatory Visit (HOSPITAL_BASED_OUTPATIENT_CLINIC_OR_DEPARTMENT_OTHER): Payer: BLUE CROSS/BLUE SHIELD | Admitting: Anesthesiology

## 2018-09-07 ENCOUNTER — Encounter (HOSPITAL_BASED_OUTPATIENT_CLINIC_OR_DEPARTMENT_OTHER): Payer: Self-pay | Admitting: *Deleted

## 2018-09-07 ENCOUNTER — Encounter (HOSPITAL_BASED_OUTPATIENT_CLINIC_OR_DEPARTMENT_OTHER)
Admission: RE | Disposition: A | Discharge status: Home / Self Care | Payer: Self-pay | Source: Ambulatory Visit | Attending: Urology

## 2018-09-07 ENCOUNTER — Ambulatory Visit (HOSPITAL_BASED_OUTPATIENT_CLINIC_OR_DEPARTMENT_OTHER)
Admission: RE | Admit: 2018-09-07 | Discharge: 2018-09-07 | Disposition: A | Discharge status: Home / Self Care | Payer: BLUE CROSS/BLUE SHIELD | Source: Ambulatory Visit | Attending: Urology | Admitting: Urology

## 2018-09-07 DIAGNOSIS — Z8249 Family history of ischemic heart disease and other diseases of the circulatory system: Secondary | ICD-10-CM | POA: Diagnosis not present

## 2018-09-07 DIAGNOSIS — Z809 Family history of malignant neoplasm, unspecified: Secondary | ICD-10-CM | POA: Diagnosis not present

## 2018-09-07 DIAGNOSIS — Z833 Family history of diabetes mellitus: Secondary | ICD-10-CM | POA: Insufficient documentation

## 2018-09-07 DIAGNOSIS — I10 Essential (primary) hypertension: Secondary | ICD-10-CM | POA: Diagnosis not present

## 2018-09-07 DIAGNOSIS — N433 Hydrocele, unspecified: Secondary | ICD-10-CM | POA: Diagnosis not present

## 2018-09-07 DIAGNOSIS — N430 Encysted hydrocele: Secondary | ICD-10-CM | POA: Diagnosis not present

## 2018-09-07 DIAGNOSIS — F419 Anxiety disorder, unspecified: Secondary | ICD-10-CM | POA: Insufficient documentation

## 2018-09-07 DIAGNOSIS — Z9049 Acquired absence of other specified parts of digestive tract: Secondary | ICD-10-CM | POA: Diagnosis not present

## 2018-09-07 DIAGNOSIS — E7800 Pure hypercholesterolemia, unspecified: Secondary | ICD-10-CM | POA: Diagnosis not present

## 2018-09-07 HISTORY — DX: Scrotal varices: I86.1

## 2018-09-07 HISTORY — DX: Elevated blood-pressure reading, without diagnosis of hypertension: R03.0

## 2018-09-07 HISTORY — DX: Other acariasis: B88.0

## 2018-09-07 HISTORY — DX: Hydrocele, unspecified: N43.3

## 2018-09-07 HISTORY — DX: Cholesterolosis of gallbladder: K82.4

## 2018-09-07 HISTORY — PX: HYDROCELE EXCISION: SHX482

## 2018-09-07 HISTORY — DX: Occlusion and stenosis of unspecified carotid artery: I65.29

## 2018-09-07 SURGERY — HYDROCELECTOMY
Anesthesia: General | Laterality: Left

## 2018-09-07 MED ORDER — LIDOCAINE HCL (PF) 1 % IJ SOLN
INTRAMUSCULAR | Status: DC | PRN
  Administered 2018-09-07: 2.5 mL

## 2018-09-07 MED ORDER — PROPOFOL 10 MG/ML IV BOLUS
INTRAVENOUS | Status: AC
  Filled 2018-09-07: qty 40

## 2018-09-07 MED ORDER — BUPIVACAINE HCL (PF) 0.25 % IJ SOLN
INTRAMUSCULAR | Status: DC | PRN
  Administered 2018-09-07: 2.5 mL
  Administered 2018-09-07: 10 mL

## 2018-09-07 MED ORDER — CEFAZOLIN SODIUM-DEXTROSE 2-4 GM/100ML-% IV SOLN
2.0000 g | INTRAVENOUS | Status: AC
  Administered 2018-09-07: 2 g via INTRAVENOUS
  Filled 2018-09-07: qty 100

## 2018-09-07 MED ORDER — CEFAZOLIN SODIUM-DEXTROSE 2-4 GM/100ML-% IV SOLN
INTRAVENOUS | Status: AC
  Filled 2018-09-07: qty 100

## 2018-09-07 MED ORDER — PROPOFOL 10 MG/ML IV BOLUS
INTRAVENOUS | Status: DC | PRN
  Administered 2018-09-07: 200 mg via INTRAVENOUS

## 2018-09-07 MED ORDER — OXYCODONE HCL 5 MG PO TABS
5.0000 mg | ORAL_TABLET | Freq: Once | ORAL | Status: DC | PRN
  Filled 2018-09-07: qty 1

## 2018-09-07 MED ORDER — SODIUM CHLORIDE 0.9% FLUSH
3.0000 mL | Freq: Two times a day (BID) | INTRAVENOUS | Status: DC
  Filled 2018-09-07: qty 3

## 2018-09-07 MED ORDER — HYDROCODONE-ACETAMINOPHEN 5-325 MG PO TABS: 1.0000 | ORAL_TABLET | Freq: Four times a day (QID) | ORAL | 0 refills | Status: AC | PRN

## 2018-09-07 MED ORDER — FENTANYL CITRATE (PF) 100 MCG/2ML IJ SOLN
25.0000 ug | INTRAMUSCULAR | Status: DC | PRN
  Filled 2018-09-07: qty 1

## 2018-09-07 MED ORDER — FENTANYL CITRATE (PF) 100 MCG/2ML IJ SOLN
INTRAMUSCULAR | Status: AC
  Filled 2018-09-07: qty 2

## 2018-09-07 MED ORDER — OXYCODONE HCL 5 MG/5ML PO SOLN
5.0000 mg | Freq: Once | ORAL | Status: DC | PRN
  Filled 2018-09-07: qty 5

## 2018-09-07 MED ORDER — FENTANYL CITRATE (PF) 100 MCG/2ML IJ SOLN
INTRAMUSCULAR | Status: DC | PRN
  Administered 2018-09-07: 50 ug via INTRAVENOUS

## 2018-09-07 MED ORDER — OXYCODONE HCL 5 MG PO TABS
5.0000 mg | ORAL_TABLET | ORAL | Status: DC | PRN
  Filled 2018-09-07: qty 2

## 2018-09-07 MED ORDER — ONDANSETRON HCL 4 MG/2ML IJ SOLN
INTRAMUSCULAR | Status: AC
  Filled 2018-09-07: qty 2

## 2018-09-07 MED ORDER — DEXAMETHASONE SODIUM PHOSPHATE 10 MG/ML IJ SOLN
INTRAMUSCULAR | Status: AC
  Filled 2018-09-07: qty 1

## 2018-09-07 MED ORDER — MORPHINE SULFATE (PF) 2 MG/ML IV SOLN
2.0000 mg | INTRAVENOUS | Status: DC | PRN
  Filled 2018-09-07: qty 1

## 2018-09-07 MED ORDER — ONDANSETRON HCL 4 MG/2ML IJ SOLN
INTRAMUSCULAR | Status: DC | PRN
  Administered 2018-09-07: 4 mg via INTRAVENOUS

## 2018-09-07 MED ORDER — ACETAMINOPHEN 325 MG PO TABS
650.0000 mg | ORAL_TABLET | ORAL | Status: DC | PRN
  Filled 2018-09-07: qty 2

## 2018-09-07 MED ORDER — DEXAMETHASONE SODIUM PHOSPHATE 10 MG/ML IJ SOLN
INTRAMUSCULAR | Status: DC | PRN
  Administered 2018-09-07: 10 mg via INTRAVENOUS

## 2018-09-07 MED ORDER — KETOROLAC TROMETHAMINE 30 MG/ML IJ SOLN
INTRAMUSCULAR | Status: DC | PRN
  Administered 2018-09-07: 30 mg via INTRAVENOUS

## 2018-09-07 MED ORDER — MIDAZOLAM HCL 2 MG/2ML IJ SOLN
INTRAMUSCULAR | Status: AC
  Filled 2018-09-07: qty 2

## 2018-09-07 MED ORDER — PHENYLEPHRINE 40 MCG/ML (10ML) SYRINGE FOR IV PUSH (FOR BLOOD PRESSURE SUPPORT)
PREFILLED_SYRINGE | INTRAVENOUS | Status: DC | PRN
  Administered 2018-09-07 (×2): 80 ug via INTRAVENOUS

## 2018-09-07 MED ORDER — LIDOCAINE 2% (20 MG/ML) 5 ML SYRINGE
INTRAMUSCULAR | Status: AC
  Filled 2018-09-07: qty 5

## 2018-09-07 MED ORDER — PHENYLEPHRINE 40 MCG/ML (10ML) SYRINGE FOR IV PUSH (FOR BLOOD PRESSURE SUPPORT)
PREFILLED_SYRINGE | INTRAVENOUS | Status: AC
  Filled 2018-09-07: qty 10

## 2018-09-07 MED ORDER — ACETAMINOPHEN 650 MG RE SUPP
650.0000 mg | RECTAL | Status: DC | PRN
  Filled 2018-09-07: qty 1

## 2018-09-07 MED ORDER — LIDOCAINE 2% (20 MG/ML) 5 ML SYRINGE
INTRAMUSCULAR | Status: DC | PRN
  Administered 2018-09-07: 100 mg via INTRAVENOUS

## 2018-09-07 MED ORDER — MIDAZOLAM HCL 5 MG/5ML IJ SOLN
INTRAMUSCULAR | Status: DC | PRN
  Administered 2018-09-07: 2 mg via INTRAVENOUS

## 2018-09-07 MED ORDER — LACTATED RINGERS IV SOLN
INTRAVENOUS | Status: DC
  Administered 2018-09-07 (×2): via INTRAVENOUS
  Filled 2018-09-07: qty 1000

## 2018-09-07 MED ORDER — SODIUM CHLORIDE 0.9 % IV SOLN
250.0000 mL | INTRAVENOUS | Status: DC | PRN
  Filled 2018-09-07: qty 250

## 2018-09-07 MED ORDER — KETOROLAC TROMETHAMINE 30 MG/ML IJ SOLN
INTRAMUSCULAR | Status: AC
  Filled 2018-09-07: qty 1

## 2018-09-07 MED ORDER — SODIUM CHLORIDE 0.9% FLUSH
3.0000 mL | INTRAVENOUS | Status: DC | PRN
  Filled 2018-09-07: qty 3

## 2018-09-07 MED ORDER — ONDANSETRON HCL 4 MG/2ML IJ SOLN
4.0000 mg | Freq: Four times a day (QID) | INTRAMUSCULAR | Status: DC | PRN
  Filled 2018-09-07: qty 2

## 2018-09-07 SURGICAL SUPPLY — 37 items
BLADE CLIPPER SURG (BLADE) ×3 IMPLANT
BLADE SURG 15 STRL LF DISP TIS (BLADE) ×1 IMPLANT
BLADE SURG 15 STRL SS (BLADE) ×2
BNDG GAUZE ELAST 4 BULKY (GAUZE/BANDAGES/DRESSINGS) ×3 IMPLANT
CANISTER SUCT 3000ML PPV (MISCELLANEOUS) ×3 IMPLANT
CANISTER SUCTION 1200CC (MISCELLANEOUS) IMPLANT
CLEANER CAUTERY TIP 5X5 PAD (MISCELLANEOUS) ×1 IMPLANT
COVER BACK TABLE 60X90IN (DRAPES) ×3 IMPLANT
COVER MAYO STAND STRL (DRAPES) ×3 IMPLANT
DISSECTOR ROUND CHERRY 3/8 STR (MISCELLANEOUS) IMPLANT
DRAIN PENROSE 18X1/4 LTX STRL (WOUND CARE) ×3 IMPLANT
DRAPE LAPAROTOMY 100X72 PEDS (DRAPES) ×3 IMPLANT
ELECT REM PT RETURN 9FT ADLT (ELECTROSURGICAL) ×3
ELECTRODE REM PT RTRN 9FT ADLT (ELECTROSURGICAL) ×1 IMPLANT
GAUZE SPONGE 4X4 12PLY STRL (GAUZE/BANDAGES/DRESSINGS) ×3 IMPLANT
GLOVE SURG SS PI 8.0 STRL IVOR (GLOVE) ×3 IMPLANT
GOWN STRL REUS W/ TWL LRG LVL3 (GOWN DISPOSABLE) ×1 IMPLANT
GOWN STRL REUS W/ TWL XL LVL3 (GOWN DISPOSABLE) ×1 IMPLANT
GOWN STRL REUS W/TWL LRG LVL3 (GOWN DISPOSABLE) ×2
GOWN STRL REUS W/TWL XL LVL3 (GOWN DISPOSABLE) ×2
KIT TURNOVER CYSTO (KITS) ×3 IMPLANT
MANIFOLD NEPTUNE II (INSTRUMENTS) IMPLANT
NEEDLE HYPO 25X1 1.5 SAFETY (NEEDLE) ×3 IMPLANT
NS IRRIG 500ML POUR BTL (IV SOLUTION) ×3 IMPLANT
PACK BASIN DAY SURGERY FS (CUSTOM PROCEDURE TRAY) ×3 IMPLANT
PAD CLEANER CAUTERY TIP 5X5 (MISCELLANEOUS) ×2
PENCIL BUTTON HOLSTER BLD 10FT (ELECTRODE) ×3 IMPLANT
SUPPORT SCROTAL LG STRP (MISCELLANEOUS) ×2 IMPLANT
SUPPORTER ATHLETIC LG (MISCELLANEOUS) ×1
SUT CHROMIC 3 0 SH 27 (SUTURE) ×6 IMPLANT
SYR CONTROL 10ML LL (SYRINGE) ×3 IMPLANT
TOWEL OR 17X24 6PK STRL BLUE (TOWEL DISPOSABLE) ×6 IMPLANT
TRAY DSU PREP LF (CUSTOM PROCEDURE TRAY) ×3 IMPLANT
TUBE CONNECTING 12'X1/4 (SUCTIONS) ×1
TUBE CONNECTING 12X1/4 (SUCTIONS) ×2 IMPLANT
WATER STERILE IRR 500ML POUR (IV SOLUTION) ×3 IMPLANT
YANKAUER SUCT BULB TIP NO VENT (SUCTIONS) ×3 IMPLANT

## 2018-09-07 NOTE — Discharge Instructions (Addendum)
°  Post Anesthesia Home Care Instructions  Activity: Get plenty of rest for the remainder of the day. A responsible adult should stay with you for 24 hours following the procedure.  For the next 24 hours, DO NOT: -Drive a car -Advertising copywriterperate machinery -Drink alcoholic beverages -Take any medication unless instructed by your physician -Make any legal decisions or sign important papers.  Meals: Start with liquid foods such as gelatin or soup. Progress to regular foods as tolerated. Avoid greasy, spicy, heavy foods. If nausea and/or vomiting occur, drink only clear liquids until the nausea and/or vomiting subsides. Call your physician if vomiting continues.  Special Instructions/Symptoms: Your throat may feel dry or sore from the anesthesia or the breathing tube placed in your throat during surgery. If this causes discomfort, gargle with warm salt water. The discomfort should disappear within 24 hours.  If you had a scopolamine patch placed behind your ear for the management of post- operative nausea and/or vomiting:  1. The medication in the patch is effective for 72 hours, after which it should be removed.  Wrap patch in a tissue and discard in the trash. Wash hands thoroughly with soap and water. 2. You may remove the patch earlier than 72 hours if you experience unpleasant side effects which may include dry mouth, dizziness or visual disturbances. 3. Avoid touching the patch. Wash your hands with soap and water after contact with the patch.   NO ADVIL, ALEVE, MOTRIN, IBUPROFEN UNTIL 2 PM TODAY    HOME CARE INSTRUCTIONS FOR SCROTAL PROCEDURES  Wound Care & Hygiene: You may apply an ice bag to the scrotum for the first 24 hours.  This may help decrease swelling and soreness.  You may have a dressing held in place by an athletic supporter.  You may remove the dressing in 24 hours and shower in 48 hours.  Continue to use the athletic supporter or tight briefs for at least a week. Activity: Rest  today - not necessarily flat bed rest.  Just take it easy.  You should not do strenuous activities until your follow-up visit with your doctor.  You may resume light activity in 48 hours.  Return to Work:  Your doctor will advise you of this depending on the type of work you do  Diet: Drink liquids or eat a light diet this evening.  You may resume a regular diet tomorrow.  General Expectations: You may have a small amount of bleeding.  The scrotum may be swollen or bruised for about a week.  Call your Doctor if these occur:  -persistent or heavy bleeding  -temperature of 101 degrees or more  -severe pain, not relieved by your pain medication  You may remove the drain in the morning and exchange the dressing.  The drain should slide out easily and if it doesn't please contact the office a [phone]

## 2018-09-07 NOTE — Anesthesia Preprocedure Evaluation (Signed)
Anesthesia Evaluation  Patient identified by MRN, date of birth, ID band Patient awake    Reviewed: Allergy & Precautions, H&P , NPO status , Patient's Chart, lab work & pertinent test results  History of Anesthesia Complications (+) Family history of anesthesia reaction  Airway Mallampati: II   Neck ROM: full    Dental   Pulmonary    breath sounds clear to auscultation       Cardiovascular hypertension, + Peripheral Vascular Disease   Rhythm:regular Rate:Normal     Neuro/Psych PSYCHIATRIC DISORDERS Anxiety TIA   GI/Hepatic GERD  ,  Endo/Other    Renal/GU      Musculoskeletal   Abdominal   Peds  Hematology   Anesthesia Other Findings   Reproductive/Obstetrics                             Anesthesia Physical Anesthesia Plan  ASA: II  Anesthesia Plan: General   Post-op Pain Management:    Induction: Intravenous  PONV Risk Score and Plan: 2 and Ondansetron, Dexamethasone, Midazolam and Treatment may vary due to age or medical condition  Airway Management Planned: LMA  Additional Equipment:   Intra-op Plan:   Post-operative Plan:   Informed Consent: I have reviewed the patients History and Physical, chart, labs and discussed the procedure including the risks, benefits and alternatives for the proposed anesthesia with the patient or authorized representative who has indicated his/her understanding and acceptance.     Plan Discussed with: CRNA, Anesthesiologist and Surgeon  Anesthesia Plan Comments:         Anesthesia Quick Evaluation

## 2018-09-07 NOTE — Anesthesia Procedure Notes (Signed)
Procedure Name: LMA Insertion Date/Time: 09/07/2018 7:36 AM Performed by: Briant Sitesenenny, Palma Buster T, CRNA Pre-anesthesia Checklist: Patient identified, Emergency Drugs available, Suction available and Patient being monitored Patient Re-evaluated:Patient Re-evaluated prior to induction Oxygen Delivery Method: Circle system utilized Preoxygenation: Pre-oxygenation with 100% oxygen Induction Type: IV induction Ventilation: Mask ventilation without difficulty LMA: LMA inserted LMA Size: 5.0 Number of attempts: 1 Airway Equipment and Method: Bite block Placement Confirmation: positive ETCO2 Tube secured with: Tape Dental Injury: Teeth and Oropharynx as per pre-operative assessment

## 2018-09-07 NOTE — Transfer of Care (Signed)
Immediate Anesthesia Transfer of Care Note  Patient: Alvin JerseyDavid T Henne  Procedure(s) Performed: HYDROCELECTOMY ADULT (Left )  Patient Location: PACU  Anesthesia Type:General  Level of Consciousness: drowsy  Airway & Oxygen Therapy: Patient Spontanous Breathing and Patient connected to nasal cannula oxygen  Post-op Assessment: Report given to RN  Post vital signs: Reviewed and stable  Last Vitals:  Vitals Value Taken Time  BP 126/77 09/07/2018  8:32 AM  Temp 36.4 C 09/07/2018  8:32 AM  Pulse 68 09/07/2018  8:32 AM  Resp 11 09/07/2018  8:32 AM  SpO2 100 % 09/07/2018  8:32 AM    Last Pain:  Vitals:   09/07/18 0631  TempSrc:   PainSc: 0-No pain      Patients Stated Pain Goal: 8 (09/07/18 0631)  Complications: No apparent anesthesia complications

## 2018-09-07 NOTE — Interval H&P Note (Signed)
History and Physical Interval Note:  09/07/2018 7:12 AM  Alvin Howard  has presented today for surgery, with the diagnosis of LEFT HYDROCELECTOMY  The various methods of treatment have been discussed with the patient and family. After consideration of risks, benefits and other options for treatment, the patient has consented to  Procedure(s): HYDROCELECTOMY ADULT (Left) as a surgical intervention .  The patient's history has been reviewed, patient examined, no change in status, stable for surgery.  I have reviewed the patient's chart and labs.  Questions were answered to the patient's satisfaction.     [name]

## 2018-09-07 NOTE — Op Note (Addendum)
Preoperative diagnosis: Left hydrocele   Postoperative diagnosis: Same   Procedure:Left hydrocele repair   Surgeon: Bjorn PippinJohn Zahki Hoogendoorn, M.D.   Assistant: Lenetta QuakerBrandon R Garren, MD  Anesthesia: Gen.   Drain: 1/4 in penrose.  Specimen: hydrocele sac.  EBL: minimal.  Complications: None.   Indications: Patient presented with chronic progressive scrotal swelling. A hydrocele was confirmed with scrotal ultrasonography. The patient was symptomatic from his hydrocele and requested surgical intervention. He appeared to understand the risks benefits potential complications of this procedure.   Procedure: The patient was properly identified and marked in the holding room. He received preoperative IV antibiotics. He was then taken to the operating room where general anesthetic was administered. The scrotum was then prepped and draped in the usual manner. Appropriate surgical timeout was performed. An oblique incision was made in the left hemiscrotum. Bovie electrocautery was used to dissect through the subcutaneous tissue layers. The hydrocele was encountered which was freed from the scrotal wall and then the testis and hydrocele sac were delivered from the incision. The hydrocele sac was then incised anteriorly and 185cc of straw colored fluid was obtained. The testis was carefully inspected and no other pathology was appreciated. The hydrocele sac was moderate in thickness.  A 2 cm rim of redundant tunical tissue was excised.The sac was then plicated posteriorly with a running 3-0 chromic suture. The spermatic cord block was then performed with 50/50 0.25% marcaine and 2% lidocaine.The testis was returned to the hemiscrotum taking great care to make sure that there was no twisting of the spermatic cord. Inspection of the inside of the scrotum revealed no significant bleeding. A quarter-inch Penrose was brought through the lower aspect of the scrotum into the affected hemiscrotum. The scrotal incision was then closed  anatomically, first with a running 3-0 chromic reapproximating the dartos fascia, and then a 3-0 chromic used to close the skin in a running vertical mattress fashion. Dermabond was applied. A fluff dressing and jockstrap was then placed Sponge and needle counts were correct. No obvious complications occurred and the patient was brought to recovery room in stable condition.

## 2018-09-07 NOTE — Anesthesia Postprocedure Evaluation (Signed)
Anesthesia Post Note  Patient: Alvin Howard  Procedure(Howard) Performed: HYDROCELECTOMY ADULT (Left )     Patient location during evaluation: PACU Anesthesia Type: General Level of consciousness: awake and alert Pain management: pain level controlled Vital Signs Assessment: post-procedure vital signs reviewed and stable Respiratory status: spontaneous breathing, nonlabored ventilation, respiratory function stable and patient connected to nasal cannula oxygen Cardiovascular status: blood pressure returned to baseline and stable Postop Assessment: no apparent nausea or vomiting Anesthetic complications: no    Last Vitals:  Vitals:   09/07/18 0845 09/07/18 0900  BP: 126/81 135/86  Pulse: 65 75  Resp: 14 (!) 23  Temp:    SpO2: 100% 98%    Last Pain:  Vitals:   09/07/18 0832  TempSrc:   PainSc: 0-No pain                 Alvin Howard

## 2018-09-08 ENCOUNTER — Encounter (HOSPITAL_BASED_OUTPATIENT_CLINIC_OR_DEPARTMENT_OTHER): Payer: Self-pay | Admitting: Urology

## 2018-09-23 DIAGNOSIS — N430 Encysted hydrocele: Secondary | ICD-10-CM | POA: Diagnosis not present

## 2018-11-12 DIAGNOSIS — H04123 Dry eye syndrome of bilateral lacrimal glands: Secondary | ICD-10-CM | POA: Diagnosis not present

## 2018-11-12 DIAGNOSIS — H16143 Punctate keratitis, bilateral: Secondary | ICD-10-CM | POA: Diagnosis not present

## 2019-01-03 DIAGNOSIS — D485 Neoplasm of uncertain behavior of skin: Secondary | ICD-10-CM | POA: Diagnosis not present

## 2019-01-03 DIAGNOSIS — Z23 Encounter for immunization: Secondary | ICD-10-CM | POA: Diagnosis not present

## 2019-01-03 DIAGNOSIS — L814 Other melanin hyperpigmentation: Secondary | ICD-10-CM | POA: Diagnosis not present

## 2019-01-03 DIAGNOSIS — L821 Other seborrheic keratosis: Secondary | ICD-10-CM | POA: Diagnosis not present

## 2019-04-19 DIAGNOSIS — M9903 Segmental and somatic dysfunction of lumbar region: Secondary | ICD-10-CM | POA: Diagnosis not present

## 2019-04-19 DIAGNOSIS — M9905 Segmental and somatic dysfunction of pelvic region: Secondary | ICD-10-CM | POA: Diagnosis not present

## 2019-04-19 DIAGNOSIS — E669 Obesity, unspecified: Secondary | ICD-10-CM | POA: Diagnosis not present

## 2019-04-19 DIAGNOSIS — M545 Low back pain: Secondary | ICD-10-CM | POA: Diagnosis not present

## 2019-04-19 DIAGNOSIS — M62838 Other muscle spasm: Secondary | ICD-10-CM | POA: Diagnosis not present

## 2019-04-19 DIAGNOSIS — R030 Elevated blood-pressure reading, without diagnosis of hypertension: Secondary | ICD-10-CM | POA: Diagnosis not present

## 2019-08-22 DIAGNOSIS — D3132 Benign neoplasm of left choroid: Secondary | ICD-10-CM | POA: Diagnosis not present

## 2019-08-23 DIAGNOSIS — H2513 Age-related nuclear cataract, bilateral: Secondary | ICD-10-CM | POA: Diagnosis not present

## 2019-08-23 DIAGNOSIS — D3132 Benign neoplasm of left choroid: Secondary | ICD-10-CM | POA: Diagnosis not present

## 2019-08-23 DIAGNOSIS — H35373 Puckering of macula, bilateral: Secondary | ICD-10-CM | POA: Diagnosis not present

## 2019-08-23 DIAGNOSIS — H33322 Round hole, left eye: Secondary | ICD-10-CM | POA: Diagnosis not present

## 2019-08-30 DIAGNOSIS — H33322 Round hole, left eye: Secondary | ICD-10-CM | POA: Diagnosis not present

## 2019-09-16 DIAGNOSIS — M25572 Pain in left ankle and joints of left foot: Secondary | ICD-10-CM | POA: Diagnosis not present

## 2019-09-16 DIAGNOSIS — H33322 Round hole, left eye: Secondary | ICD-10-CM | POA: Diagnosis not present

## 2019-09-26 DIAGNOSIS — H33322 Round hole, left eye: Secondary | ICD-10-CM | POA: Diagnosis not present

## 2019-10-17 DIAGNOSIS — D485 Neoplasm of uncertain behavior of skin: Secondary | ICD-10-CM | POA: Diagnosis not present

## 2019-10-17 DIAGNOSIS — C44319 Basal cell carcinoma of skin of other parts of face: Secondary | ICD-10-CM | POA: Diagnosis not present

## 2019-11-07 DIAGNOSIS — R52 Pain, unspecified: Secondary | ICD-10-CM | POA: Diagnosis not present

## 2019-11-07 DIAGNOSIS — J029 Acute pharyngitis, unspecified: Secondary | ICD-10-CM | POA: Diagnosis not present

## 2019-11-07 DIAGNOSIS — R05 Cough: Secondary | ICD-10-CM | POA: Diagnosis not present

## 2019-11-07 DIAGNOSIS — R6889 Other general symptoms and signs: Secondary | ICD-10-CM | POA: Diagnosis not present

## 2019-11-07 DIAGNOSIS — R0981 Nasal congestion: Secondary | ICD-10-CM | POA: Diagnosis not present

## 2019-12-14 DIAGNOSIS — C44319 Basal cell carcinoma of skin of other parts of face: Secondary | ICD-10-CM | POA: Diagnosis not present

## 2019-12-19 DIAGNOSIS — H35373 Puckering of macula, bilateral: Secondary | ICD-10-CM | POA: Diagnosis not present

## 2019-12-19 DIAGNOSIS — D3132 Benign neoplasm of left choroid: Secondary | ICD-10-CM | POA: Diagnosis not present

## 2019-12-19 DIAGNOSIS — H31092 Other chorioretinal scars, left eye: Secondary | ICD-10-CM | POA: Diagnosis not present

## 2019-12-19 DIAGNOSIS — H2513 Age-related nuclear cataract, bilateral: Secondary | ICD-10-CM | POA: Diagnosis not present

## 2019-12-22 ENCOUNTER — Ambulatory Visit (HOSPITAL_COMMUNITY)
Admission: RE | Admit: 2019-12-22 | Discharge: 2019-12-22 | Disposition: A | Discharge status: Home / Self Care | Payer: BC Managed Care – PPO | Source: Ambulatory Visit | Attending: Internal Medicine | Admitting: Internal Medicine

## 2019-12-22 ENCOUNTER — Other Ambulatory Visit: Payer: Self-pay | Admitting: Internal Medicine

## 2019-12-22 ENCOUNTER — Other Ambulatory Visit (HOSPITAL_COMMUNITY): Payer: Self-pay | Admitting: Internal Medicine

## 2019-12-22 ENCOUNTER — Other Ambulatory Visit: Payer: Self-pay

## 2019-12-22 DIAGNOSIS — I10 Essential (primary) hypertension: Secondary | ICD-10-CM | POA: Diagnosis not present

## 2019-12-22 DIAGNOSIS — R059 Cough, unspecified: Secondary | ICD-10-CM

## 2019-12-22 DIAGNOSIS — I8002 Phlebitis and thrombophlebitis of superficial vessels of left lower extremity: Secondary | ICD-10-CM | POA: Diagnosis not present

## 2019-12-22 DIAGNOSIS — R05 Cough: Secondary | ICD-10-CM

## 2019-12-22 DIAGNOSIS — M79605 Pain in left leg: Secondary | ICD-10-CM

## 2019-12-22 DIAGNOSIS — Z1389 Encounter for screening for other disorder: Secondary | ICD-10-CM | POA: Diagnosis not present

## 2019-12-22 DIAGNOSIS — E6609 Other obesity due to excess calories: Secondary | ICD-10-CM | POA: Diagnosis not present

## 2019-12-22 DIAGNOSIS — E7489 Other specified disorders of carbohydrate metabolism: Secondary | ICD-10-CM | POA: Diagnosis not present

## 2019-12-22 DIAGNOSIS — M7989 Other specified soft tissue disorders: Secondary | ICD-10-CM | POA: Diagnosis not present

## 2019-12-22 DIAGNOSIS — Z6833 Body mass index (BMI) 33.0-33.9, adult: Secondary | ICD-10-CM | POA: Diagnosis not present

## 2020-01-04 DIAGNOSIS — L570 Actinic keratosis: Secondary | ICD-10-CM | POA: Diagnosis not present

## 2020-01-04 DIAGNOSIS — L814 Other melanin hyperpigmentation: Secondary | ICD-10-CM | POA: Diagnosis not present

## 2020-01-04 DIAGNOSIS — L578 Other skin changes due to chronic exposure to nonionizing radiation: Secondary | ICD-10-CM | POA: Diagnosis not present

## 2020-01-04 DIAGNOSIS — D225 Melanocytic nevi of trunk: Secondary | ICD-10-CM | POA: Diagnosis not present

## 2020-01-04 DIAGNOSIS — L821 Other seborrheic keratosis: Secondary | ICD-10-CM | POA: Diagnosis not present

## 2020-06-18 DIAGNOSIS — H35373 Puckering of macula, bilateral: Secondary | ICD-10-CM | POA: Diagnosis not present

## 2020-06-18 DIAGNOSIS — D3132 Benign neoplasm of left choroid: Secondary | ICD-10-CM | POA: Diagnosis not present

## 2020-06-18 DIAGNOSIS — H31092 Other chorioretinal scars, left eye: Secondary | ICD-10-CM | POA: Diagnosis not present

## 2020-06-18 DIAGNOSIS — H2513 Age-related nuclear cataract, bilateral: Secondary | ICD-10-CM | POA: Diagnosis not present

## 2020-06-28 DIAGNOSIS — E6609 Other obesity due to excess calories: Secondary | ICD-10-CM | POA: Diagnosis not present

## 2020-06-28 DIAGNOSIS — Z1389 Encounter for screening for other disorder: Secondary | ICD-10-CM | POA: Diagnosis not present

## 2020-06-28 DIAGNOSIS — T50905A Adverse effect of unspecified drugs, medicaments and biological substances, initial encounter: Secondary | ICD-10-CM | POA: Diagnosis not present

## 2020-06-28 DIAGNOSIS — Z6834 Body mass index (BMI) 34.0-34.9, adult: Secondary | ICD-10-CM | POA: Diagnosis not present

## 2020-06-28 DIAGNOSIS — J309 Allergic rhinitis, unspecified: Secondary | ICD-10-CM | POA: Diagnosis not present

## 2020-11-07 DIAGNOSIS — L738 Other specified follicular disorders: Secondary | ICD-10-CM | POA: Diagnosis not present

## 2020-11-07 DIAGNOSIS — L820 Inflamed seborrheic keratosis: Secondary | ICD-10-CM | POA: Diagnosis not present

## 2020-11-07 DIAGNOSIS — D1801 Hemangioma of skin and subcutaneous tissue: Secondary | ICD-10-CM | POA: Diagnosis not present

## 2020-12-27 DIAGNOSIS — J329 Chronic sinusitis, unspecified: Secondary | ICD-10-CM | POA: Diagnosis not present

## 2021-01-08 DIAGNOSIS — L570 Actinic keratosis: Secondary | ICD-10-CM | POA: Diagnosis not present

## 2021-01-08 DIAGNOSIS — L578 Other skin changes due to chronic exposure to nonionizing radiation: Secondary | ICD-10-CM | POA: Diagnosis not present

## 2021-01-08 DIAGNOSIS — L821 Other seborrheic keratosis: Secondary | ICD-10-CM | POA: Diagnosis not present

## 2021-01-08 DIAGNOSIS — D225 Melanocytic nevi of trunk: Secondary | ICD-10-CM | POA: Diagnosis not present

## 2021-01-08 DIAGNOSIS — L814 Other melanin hyperpigmentation: Secondary | ICD-10-CM | POA: Diagnosis not present

## 2021-02-26 DIAGNOSIS — S93602A Unspecified sprain of left foot, initial encounter: Secondary | ICD-10-CM | POA: Diagnosis not present

## 2021-05-01 IMAGING — US US EXTREM LOW VENOUS*L*
1 series · 13 of 24 positions shown · non-contrast
Comparison: None.

CLINICAL DATA: Left calf pain



[Series 1: us extrem low venous*left* · 13 of 41 slices shown]
[im 1/41]
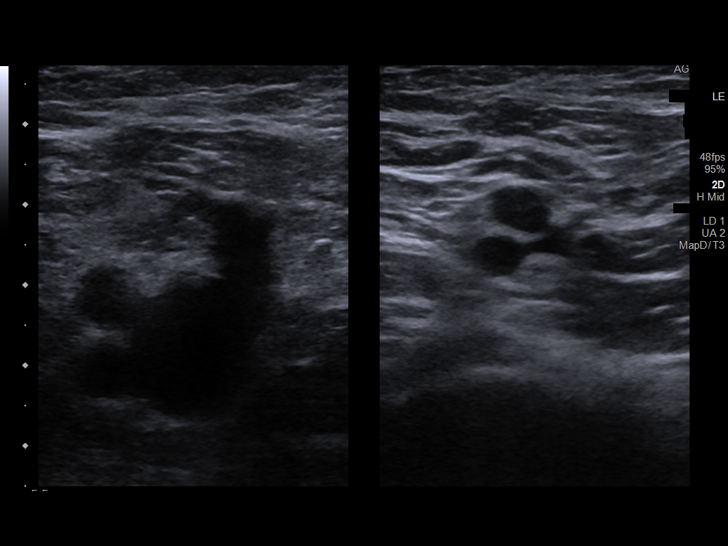
[im 4/41]
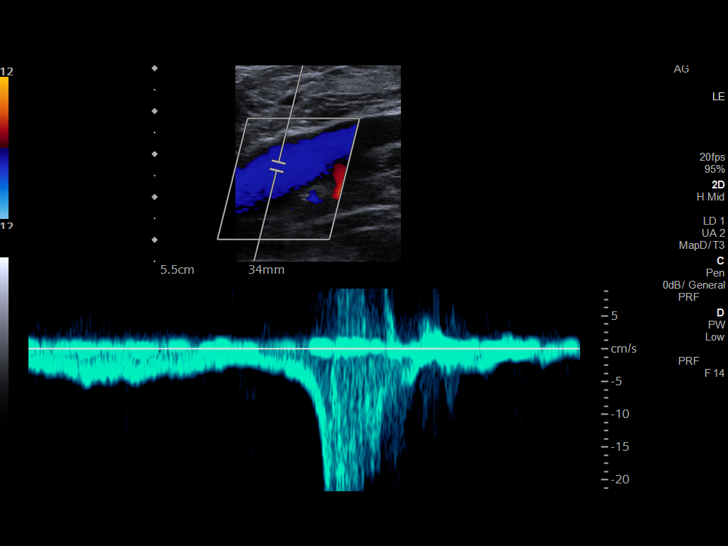
[im 7/41]
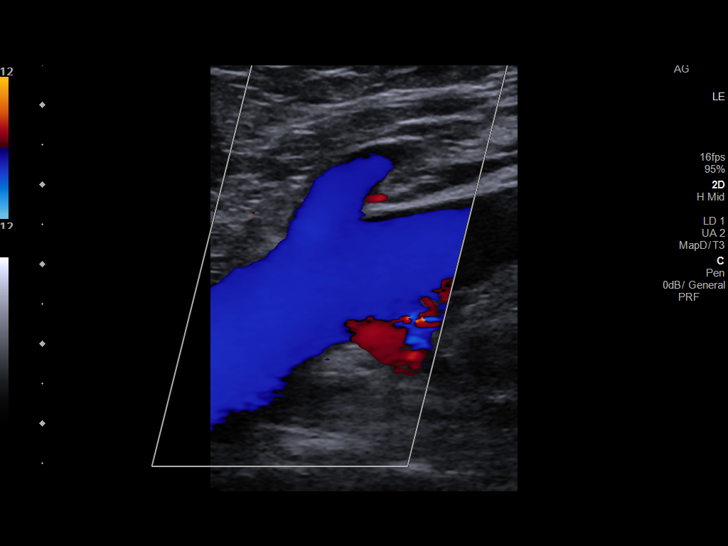
[im 11/41]
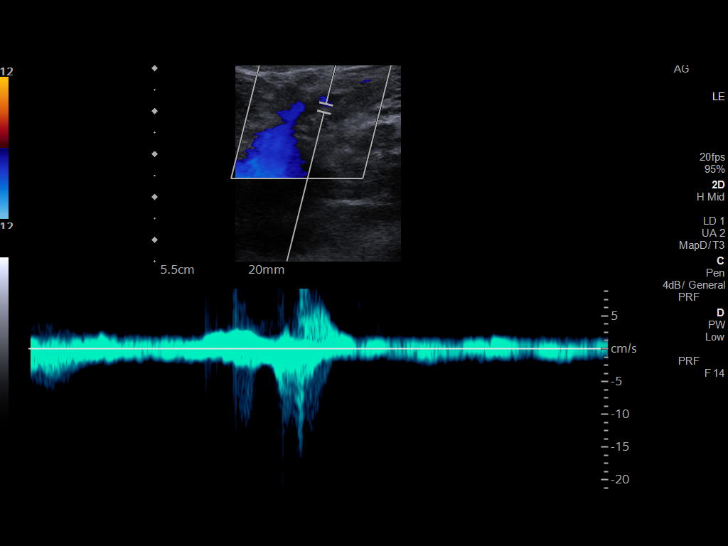
[im 14/41]
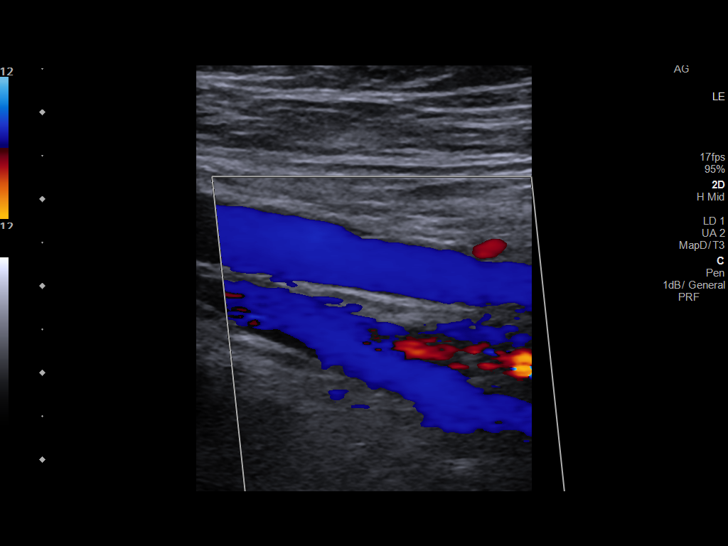
[im 18/41]
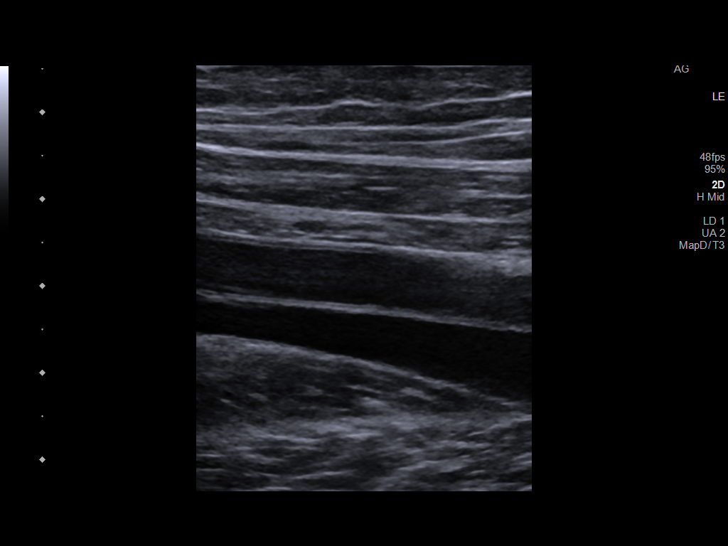
[im 21/41]
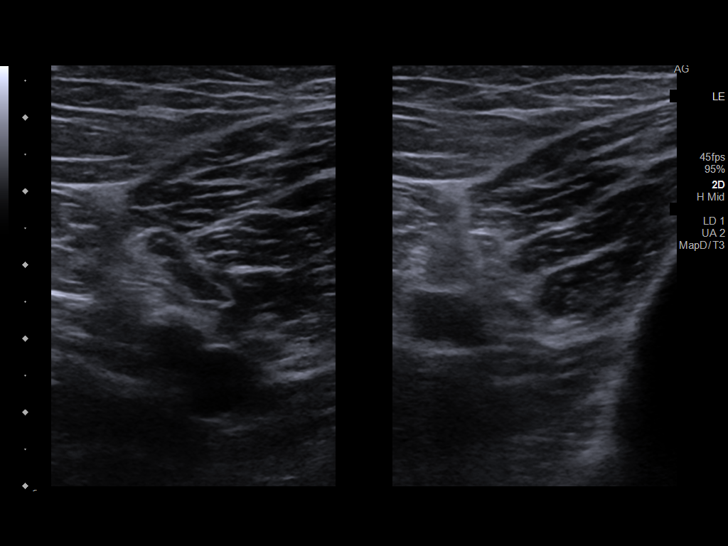
[im 23/41]
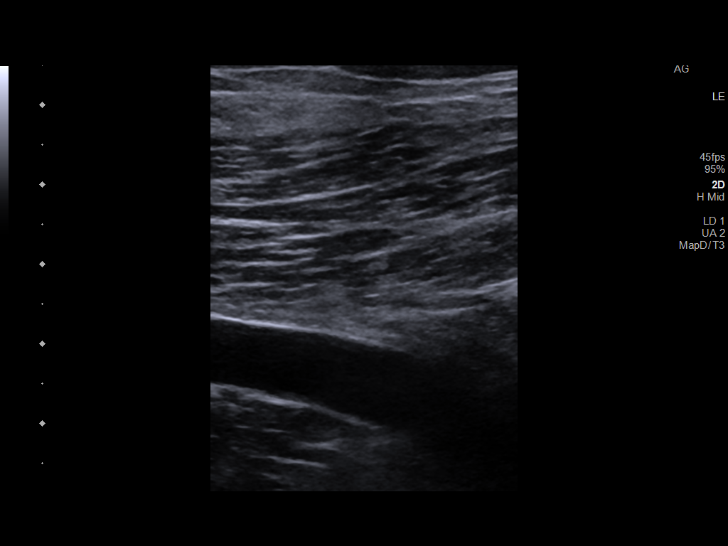
[im 27/41]
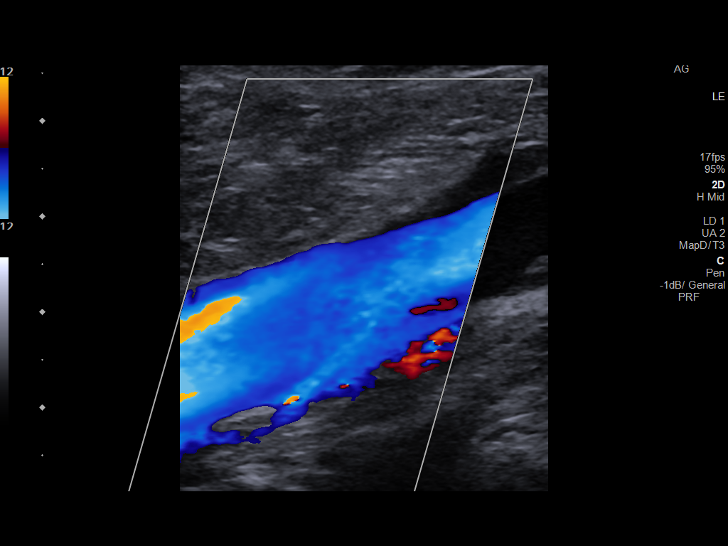
[im 30/41]
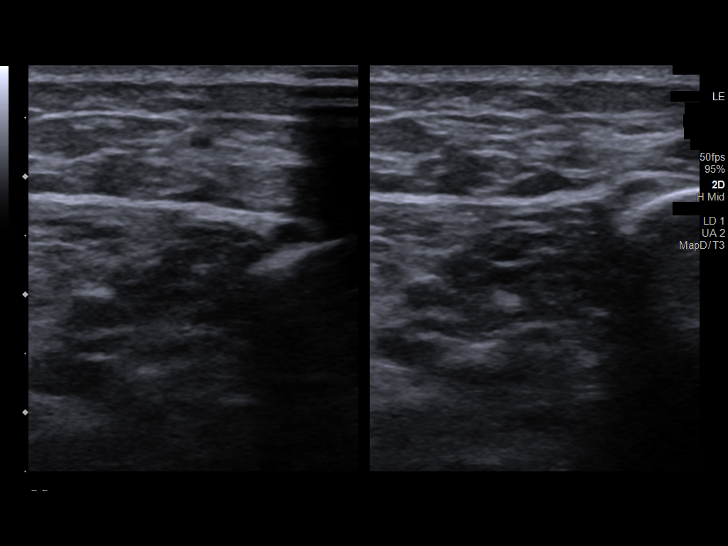
[im 34/41]
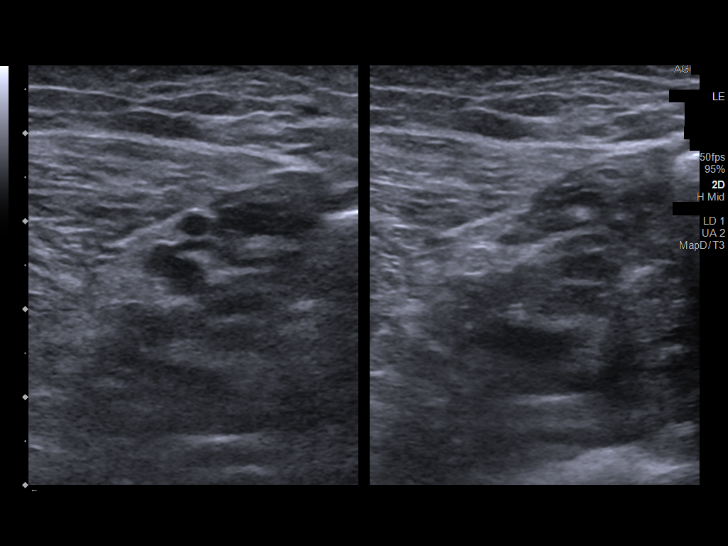
[im 37/41]
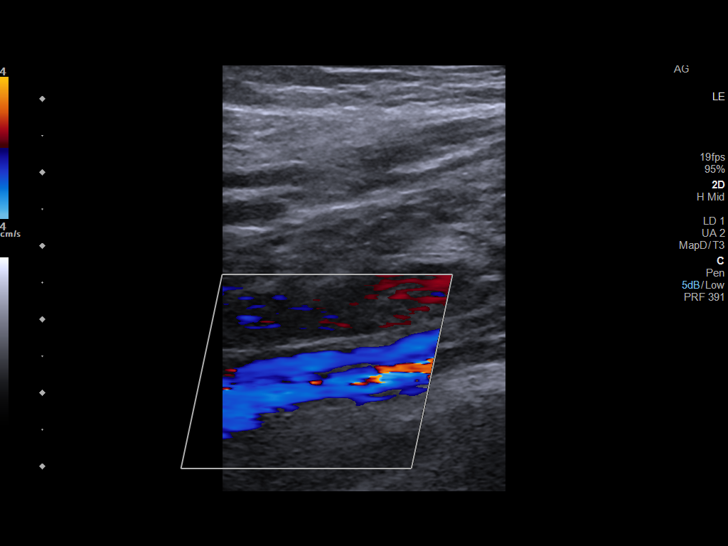
[im 41/41]
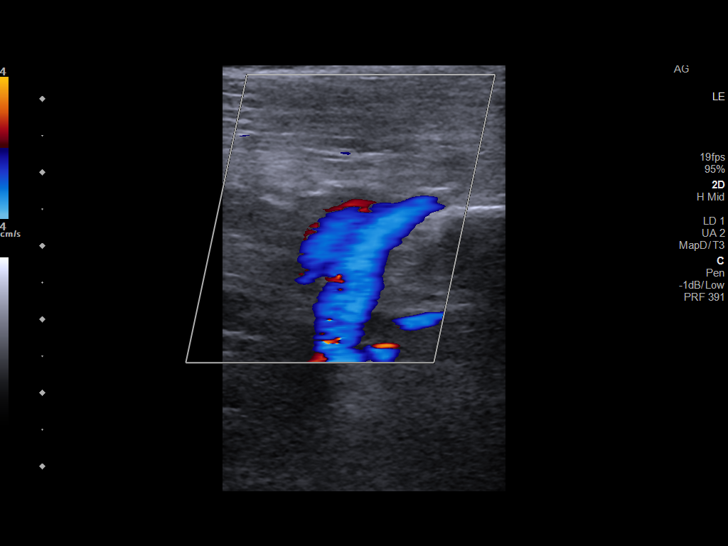

[13 of 24 positions shown; findings below may reference images not displayed]

FINDINGS: Contralateral Common Femoral Vein: Respiratory phasicity is normal
and symmetric with the symptomatic side. No evidence of thrombus.
Normal compressibility.

Common Femoral Vein: No evidence of thrombus. Normal
compressibility, respiratory phasicity and response to augmentation.

Saphenofemoral Junction: No evidence of thrombus. Normal
compressibility and flow on color Doppler imaging.

Profunda Femoral Vein: No evidence of thrombus. Normal
compressibility and flow on color Doppler imaging.

Femoral Vein: No evidence of thrombus. Normal compressibility,
respiratory phasicity and response to augmentation.

Popliteal Vein: No evidence of thrombus. Normal compressibility,
respiratory phasicity and response to augmentation.

Calf Veins: No evidence of thrombus. Normal compressibility and flow
on color Doppler imaging.

Superficial Great Saphenous Vein: Great saphenous vein is patent.
There are thrombosed varicosities in the left calf in the area of
pain.

Venous Reflux:  None.

Other Findings:  None.
IMPRESSION: No evidence of deep venous thrombosis.

Superficial thrombophlebitis within left calf varicosities.

## 2021-05-14 DIAGNOSIS — R5382 Chronic fatigue, unspecified: Secondary | ICD-10-CM | POA: Diagnosis not present

## 2021-06-20 DIAGNOSIS — H43392 Other vitreous opacities, left eye: Secondary | ICD-10-CM | POA: Diagnosis not present

## 2021-07-12 DIAGNOSIS — H31092 Other chorioretinal scars, left eye: Secondary | ICD-10-CM | POA: Diagnosis not present

## 2021-07-12 DIAGNOSIS — H2513 Age-related nuclear cataract, bilateral: Secondary | ICD-10-CM | POA: Diagnosis not present

## 2021-07-12 DIAGNOSIS — M9903 Segmental and somatic dysfunction of lumbar region: Secondary | ICD-10-CM | POA: Diagnosis not present

## 2021-07-12 DIAGNOSIS — H35373 Puckering of macula, bilateral: Secondary | ICD-10-CM | POA: Diagnosis not present

## 2021-07-12 DIAGNOSIS — D3132 Benign neoplasm of left choroid: Secondary | ICD-10-CM | POA: Diagnosis not present

## 2021-07-12 DIAGNOSIS — M9905 Segmental and somatic dysfunction of pelvic region: Secondary | ICD-10-CM | POA: Diagnosis not present

## 2021-07-12 DIAGNOSIS — M9904 Segmental and somatic dysfunction of sacral region: Secondary | ICD-10-CM | POA: Diagnosis not present

## 2021-07-12 DIAGNOSIS — S39012A Strain of muscle, fascia and tendon of lower back, initial encounter: Secondary | ICD-10-CM | POA: Diagnosis not present

## 2021-07-18 DIAGNOSIS — M9903 Segmental and somatic dysfunction of lumbar region: Secondary | ICD-10-CM | POA: Diagnosis not present

## 2021-07-18 DIAGNOSIS — M9905 Segmental and somatic dysfunction of pelvic region: Secondary | ICD-10-CM | POA: Diagnosis not present

## 2021-07-18 DIAGNOSIS — M9904 Segmental and somatic dysfunction of sacral region: Secondary | ICD-10-CM | POA: Diagnosis not present

## 2021-07-18 DIAGNOSIS — S39012A Strain of muscle, fascia and tendon of lower back, initial encounter: Secondary | ICD-10-CM | POA: Diagnosis not present

## 2021-09-11 DIAGNOSIS — D179 Benign lipomatous neoplasm, unspecified: Secondary | ICD-10-CM | POA: Diagnosis not present

## 2021-09-11 DIAGNOSIS — I10 Essential (primary) hypertension: Secondary | ICD-10-CM | POA: Diagnosis not present

## 2021-09-11 DIAGNOSIS — I872 Venous insufficiency (chronic) (peripheral): Secondary | ICD-10-CM | POA: Diagnosis not present

## 2021-09-11 DIAGNOSIS — R600 Localized edema: Secondary | ICD-10-CM | POA: Diagnosis not present

## 2021-11-12 DIAGNOSIS — Z6834 Body mass index (BMI) 34.0-34.9, adult: Secondary | ICD-10-CM | POA: Diagnosis not present

## 2021-11-12 DIAGNOSIS — H10232 Serous conjunctivitis, except viral, left eye: Secondary | ICD-10-CM | POA: Diagnosis not present

## 2021-11-12 DIAGNOSIS — L570 Actinic keratosis: Secondary | ICD-10-CM | POA: Diagnosis not present

## 2021-11-12 DIAGNOSIS — E663 Overweight: Secondary | ICD-10-CM | POA: Diagnosis not present

## 2021-11-12 DIAGNOSIS — H00015 Hordeolum externum left lower eyelid: Secondary | ICD-10-CM | POA: Diagnosis not present

## 2021-11-12 DIAGNOSIS — E6609 Other obesity due to excess calories: Secondary | ICD-10-CM | POA: Diagnosis not present

## 2021-11-12 DIAGNOSIS — R5382 Chronic fatigue, unspecified: Secondary | ICD-10-CM | POA: Diagnosis not present

## 2021-11-12 DIAGNOSIS — L658 Other specified nonscarring hair loss: Secondary | ICD-10-CM | POA: Diagnosis not present

## 2022-01-20 DIAGNOSIS — L570 Actinic keratosis: Secondary | ICD-10-CM | POA: Diagnosis not present

## 2022-01-20 DIAGNOSIS — D225 Melanocytic nevi of trunk: Secondary | ICD-10-CM | POA: Diagnosis not present

## 2022-01-20 DIAGNOSIS — L814 Other melanin hyperpigmentation: Secondary | ICD-10-CM | POA: Diagnosis not present

## 2022-01-20 DIAGNOSIS — L821 Other seborrheic keratosis: Secondary | ICD-10-CM | POA: Diagnosis not present

## 2022-01-20 DIAGNOSIS — L578 Other skin changes due to chronic exposure to nonionizing radiation: Secondary | ICD-10-CM | POA: Diagnosis not present

## 2022-02-20 DIAGNOSIS — Z6833 Body mass index (BMI) 33.0-33.9, adult: Secondary | ICD-10-CM | POA: Diagnosis not present

## 2022-02-20 DIAGNOSIS — J0100 Acute maxillary sinusitis, unspecified: Secondary | ICD-10-CM | POA: Diagnosis not present

## 2022-02-20 DIAGNOSIS — J029 Acute pharyngitis, unspecified: Secondary | ICD-10-CM | POA: Diagnosis not present

## 2022-02-20 DIAGNOSIS — R059 Cough, unspecified: Secondary | ICD-10-CM | POA: Diagnosis not present

## 2022-03-13 DIAGNOSIS — R5382 Chronic fatigue, unspecified: Secondary | ICD-10-CM | POA: Diagnosis not present

## 2022-03-13 DIAGNOSIS — E663 Overweight: Secondary | ICD-10-CM | POA: Diagnosis not present

## 2022-03-13 DIAGNOSIS — L658 Other specified nonscarring hair loss: Secondary | ICD-10-CM | POA: Diagnosis not present

## 2022-03-13 DIAGNOSIS — E6609 Other obesity due to excess calories: Secondary | ICD-10-CM | POA: Diagnosis not present

## 2022-05-12 DIAGNOSIS — J0100 Acute maxillary sinusitis, unspecified: Secondary | ICD-10-CM | POA: Diagnosis not present

## 2022-05-12 DIAGNOSIS — J40 Bronchitis, not specified as acute or chronic: Secondary | ICD-10-CM | POA: Diagnosis not present

## 2022-08-05 DIAGNOSIS — L658 Other specified nonscarring hair loss: Secondary | ICD-10-CM | POA: Diagnosis not present

## 2022-08-05 DIAGNOSIS — R6882 Decreased libido: Secondary | ICD-10-CM | POA: Diagnosis not present

## 2022-09-01 DIAGNOSIS — H00015 Hordeolum externum left lower eyelid: Secondary | ICD-10-CM | POA: Diagnosis not present

## 2022-10-21 DIAGNOSIS — Z0001 Encounter for general adult medical examination with abnormal findings: Secondary | ICD-10-CM | POA: Diagnosis not present

## 2022-10-21 DIAGNOSIS — E663 Overweight: Secondary | ICD-10-CM | POA: Diagnosis not present

## 2022-10-21 DIAGNOSIS — I10 Essential (primary) hypertension: Secondary | ICD-10-CM | POA: Diagnosis not present

## 2022-10-21 DIAGNOSIS — Z1331 Encounter for screening for depression: Secondary | ICD-10-CM | POA: Diagnosis not present

## 2022-10-21 DIAGNOSIS — Z6829 Body mass index (BMI) 29.0-29.9, adult: Secondary | ICD-10-CM | POA: Diagnosis not present

## 2022-10-30 ENCOUNTER — Encounter: Payer: Self-pay | Admitting: *Deleted

## 2022-11-02 ENCOUNTER — Encounter (INDEPENDENT_AMBULATORY_CARE_PROVIDER_SITE_OTHER): Payer: Self-pay | Admitting: Gastroenterology

## 2022-11-04 DIAGNOSIS — H35373 Puckering of macula, bilateral: Secondary | ICD-10-CM | POA: Diagnosis not present

## 2022-11-19 DIAGNOSIS — H5310 Unspecified subjective visual disturbances: Secondary | ICD-10-CM | POA: Diagnosis not present

## 2022-12-09 DIAGNOSIS — R6882 Decreased libido: Secondary | ICD-10-CM | POA: Diagnosis not present

## 2022-12-09 DIAGNOSIS — L659 Nonscarring hair loss, unspecified: Secondary | ICD-10-CM | POA: Diagnosis not present

## 2023-01-26 DIAGNOSIS — D2261 Melanocytic nevi of right upper limb, including shoulder: Secondary | ICD-10-CM | POA: Diagnosis not present

## 2023-01-26 DIAGNOSIS — L578 Other skin changes due to chronic exposure to nonionizing radiation: Secondary | ICD-10-CM | POA: Diagnosis not present

## 2023-01-26 DIAGNOSIS — D485 Neoplasm of uncertain behavior of skin: Secondary | ICD-10-CM | POA: Diagnosis not present

## 2023-01-26 DIAGNOSIS — D225 Melanocytic nevi of trunk: Secondary | ICD-10-CM | POA: Diagnosis not present

## 2023-01-26 DIAGNOSIS — L821 Other seborrheic keratosis: Secondary | ICD-10-CM | POA: Diagnosis not present

## 2023-01-26 DIAGNOSIS — L814 Other melanin hyperpigmentation: Secondary | ICD-10-CM | POA: Diagnosis not present

## 2023-01-26 DIAGNOSIS — L570 Actinic keratosis: Secondary | ICD-10-CM | POA: Diagnosis not present

## 2023-03-06 DIAGNOSIS — Z6830 Body mass index (BMI) 30.0-30.9, adult: Secondary | ICD-10-CM | POA: Diagnosis not present

## 2023-03-06 DIAGNOSIS — S61412A Laceration without foreign body of left hand, initial encounter: Secondary | ICD-10-CM | POA: Diagnosis not present

## 2023-03-06 DIAGNOSIS — E669 Obesity, unspecified: Secondary | ICD-10-CM | POA: Diagnosis not present

## 2023-03-06 DIAGNOSIS — I10 Essential (primary) hypertension: Secondary | ICD-10-CM | POA: Diagnosis not present

## 2023-04-07 DIAGNOSIS — L110 Acquired keratosis follicularis: Secondary | ICD-10-CM | POA: Diagnosis not present

## 2023-04-07 DIAGNOSIS — M79671 Pain in right foot: Secondary | ICD-10-CM | POA: Diagnosis not present

## 2023-04-07 DIAGNOSIS — Q828 Other specified congenital malformations of skin: Secondary | ICD-10-CM | POA: Diagnosis not present

## 2023-04-07 DIAGNOSIS — M79674 Pain in right toe(s): Secondary | ICD-10-CM | POA: Diagnosis not present

## 2023-04-07 DIAGNOSIS — L565 Disseminated superficial actinic porokeratosis (DSAP): Secondary | ICD-10-CM | POA: Diagnosis not present

## 2023-04-28 ENCOUNTER — Encounter: Payer: Self-pay | Admitting: *Deleted

## 2023-05-05 DIAGNOSIS — R5382 Chronic fatigue, unspecified: Secondary | ICD-10-CM | POA: Diagnosis not present

## 2023-05-05 DIAGNOSIS — R891 Abnormal level of hormones in specimens from other organs, systems and tissues: Secondary | ICD-10-CM | POA: Diagnosis not present

## 2023-05-05 DIAGNOSIS — L658 Other specified nonscarring hair loss: Secondary | ICD-10-CM | POA: Diagnosis not present

## 2023-05-26 DIAGNOSIS — R972 Elevated prostate specific antigen [PSA]: Secondary | ICD-10-CM | POA: Diagnosis not present

## 2023-05-26 DIAGNOSIS — R5382 Chronic fatigue, unspecified: Secondary | ICD-10-CM | POA: Diagnosis not present

## 2023-06-17 DIAGNOSIS — H35363 Drusen (degenerative) of macula, bilateral: Secondary | ICD-10-CM | POA: Diagnosis not present

## 2023-10-14 DIAGNOSIS — R5382 Chronic fatigue, unspecified: Secondary | ICD-10-CM | POA: Diagnosis not present

## 2023-10-14 DIAGNOSIS — R891 Abnormal level of hormones in specimens from other organs, systems and tissues: Secondary | ICD-10-CM | POA: Diagnosis not present

## 2023-10-14 DIAGNOSIS — L658 Other specified nonscarring hair loss: Secondary | ICD-10-CM | POA: Diagnosis not present

## 2023-12-14 DIAGNOSIS — R051 Acute cough: Secondary | ICD-10-CM | POA: Diagnosis not present

## 2023-12-14 DIAGNOSIS — B349 Viral infection, unspecified: Secondary | ICD-10-CM | POA: Diagnosis not present

## 2023-12-14 DIAGNOSIS — J029 Acute pharyngitis, unspecified: Secondary | ICD-10-CM | POA: Diagnosis not present

## 2023-12-14 DIAGNOSIS — I10 Essential (primary) hypertension: Secondary | ICD-10-CM | POA: Diagnosis not present

## 2023-12-18 DIAGNOSIS — Z1331 Encounter for screening for depression: Secondary | ICD-10-CM | POA: Diagnosis not present

## 2023-12-18 DIAGNOSIS — E663 Overweight: Secondary | ICD-10-CM | POA: Diagnosis not present

## 2023-12-18 DIAGNOSIS — Z6829 Body mass index (BMI) 29.0-29.9, adult: Secondary | ICD-10-CM | POA: Diagnosis not present

## 2023-12-18 DIAGNOSIS — I872 Venous insufficiency (chronic) (peripheral): Secondary | ICD-10-CM | POA: Diagnosis not present

## 2023-12-18 DIAGNOSIS — Z125 Encounter for screening for malignant neoplasm of prostate: Secondary | ICD-10-CM | POA: Diagnosis not present

## 2023-12-18 DIAGNOSIS — I10 Essential (primary) hypertension: Secondary | ICD-10-CM | POA: Diagnosis not present

## 2023-12-18 DIAGNOSIS — M5481 Occipital neuralgia: Secondary | ICD-10-CM | POA: Diagnosis not present

## 2023-12-18 DIAGNOSIS — Z9229 Personal history of other drug therapy: Secondary | ICD-10-CM | POA: Diagnosis not present

## 2023-12-18 DIAGNOSIS — J0100 Acute maxillary sinusitis, unspecified: Secondary | ICD-10-CM | POA: Diagnosis not present

## 2023-12-18 DIAGNOSIS — M47812 Spondylosis without myelopathy or radiculopathy, cervical region: Secondary | ICD-10-CM | POA: Diagnosis not present

## 2023-12-18 DIAGNOSIS — Z0001 Encounter for general adult medical examination with abnormal findings: Secondary | ICD-10-CM | POA: Diagnosis not present

## 2024-01-20 DIAGNOSIS — H00012 Hordeolum externum right lower eyelid: Secondary | ICD-10-CM | POA: Diagnosis not present

## 2024-01-26 DIAGNOSIS — L578 Other skin changes due to chronic exposure to nonionizing radiation: Secondary | ICD-10-CM | POA: Diagnosis not present

## 2024-01-26 DIAGNOSIS — L570 Actinic keratosis: Secondary | ICD-10-CM | POA: Diagnosis not present

## 2024-01-26 DIAGNOSIS — L821 Other seborrheic keratosis: Secondary | ICD-10-CM | POA: Diagnosis not present

## 2024-01-26 DIAGNOSIS — D225 Melanocytic nevi of trunk: Secondary | ICD-10-CM | POA: Diagnosis not present

## 2024-01-26 DIAGNOSIS — L814 Other melanin hyperpigmentation: Secondary | ICD-10-CM | POA: Diagnosis not present

## 2024-03-09 ENCOUNTER — Telehealth (INDEPENDENT_AMBULATORY_CARE_PROVIDER_SITE_OTHER): Payer: Self-pay | Admitting: Gastroenterology

## 2024-03-09 NOTE — Telephone Encounter (Signed)
 Who is your primary care physician: Dr.Fusco/[facility]  Reasons for the colonoscopy:   Have you had a colonoscopy before?  Yes 72 and 58 year old with Dr.Rehman  Do you have family history of colon cancer? Yes mother  Previous colonoscopy with polyps removed? no  Do you have a history colorectal cancer?   no  Are you diabetic? If yes, Type 1 or Type 2?    no  Do you have a prosthetic or mechanical heart valve? no  Do you have a pacemaker/defibrillator?   no  Have you had endocarditis/atrial fibrillation? no  Have you had joint replacement within the last 12 months?  no  Do you tend to be constipated or have to use laxatives? yes  Do you have any history of drugs or alchohol?  Yes social 1-2 wk  Do you use supplemental oxygen?  no  Have you had a stroke or heart attack within the last 6 months? no  Do you take weight loss medication?  no  Do you take any blood-thinning medications such as: (aspirin, warfarin, Plavix, Aggrenox)  no  If yes we need the name, milligram, dosage and who is prescribing doctor  Current Outpatient Medications on File Prior to Visit  Medication Sig Dispense Refill   UNABLE TO FIND Med Name: Magnesium Malate 1350 mg; Juice Plus     aspirin 81 MG chewable tablet Chew 81 mg by mouth daily. (Patient not taking: Reported on 03/09/2024)     fish oil-omega-3 fatty acids 1000 MG capsule Take 2 g by mouth daily.      HYDROcodone-acetaminophen (NORCO) 5-325 MG tablet Take 1 tablet by mouth every 6 (six) hours as needed for moderate pain. (Patient not taking: Reported on 03/09/2024) 8 tablet 0   losartan (COZAAR) 50 MG tablet Take 50 mg by mouth daily.     Nutritional Supplements (JUICE PLUS FIBRE PO) Take by mouth.     No current facility-administered medications on file prior to visit.    No Known Allergies   Pharmacy: [facility]  Primary Insurance Name: [employer]  Best number where you can be reached: [phone]

## 2024-03-12 NOTE — Telephone Encounter (Signed)
Room 1 , 2 day prep Thanks 

## 2024-03-14 NOTE — Telephone Encounter (Signed)
 Pt contacted. Pt will need to call me back to schedule.

## 2024-04-11 NOTE — Telephone Encounter (Signed)
 Pt wife left voicemail to schedule pt TCS. Pt would like to be schedule for 05/24/24 at 7:30. Days in May would not work for pt since he technically wanted a Monday. Will place pt on schedule once June schedule opens. Will mail instructions and send prep at that time

## 2024-04-27 MED ORDER — PEG 3350-KCL-NA BICARB-NACL 420 G PO SOLR: 4000.0000 mL | Freq: Once | ORAL | 0 refills | Status: AC

## 2024-04-27 NOTE — Telephone Encounter (Signed)
 Pt contacted to make sure 05/24/24 at 7:30AM would still be ok for TCS. Pt asked if he could call back to confirm. Pt did call back and confirmed 05/24/24 at 7:30am. Prep sent to pharmacy. No PA needed per insurance.

## 2024-04-27 NOTE — Addendum Note (Signed)
 Addended by: Naelani Lafrance on: 04/27/2024 03:52 PM   Modules accepted: Orders

## 2024-04-28 ENCOUNTER — Encounter (INDEPENDENT_AMBULATORY_CARE_PROVIDER_SITE_OTHER): Payer: Self-pay | Admitting: *Deleted

## 2024-05-12 DIAGNOSIS — Z6830 Body mass index (BMI) 30.0-30.9, adult: Secondary | ICD-10-CM | POA: Diagnosis not present

## 2024-05-12 DIAGNOSIS — E669 Obesity, unspecified: Secondary | ICD-10-CM | POA: Diagnosis not present

## 2024-05-12 DIAGNOSIS — R0981 Nasal congestion: Secondary | ICD-10-CM | POA: Diagnosis not present

## 2024-05-12 DIAGNOSIS — I10 Essential (primary) hypertension: Secondary | ICD-10-CM | POA: Diagnosis not present

## 2024-05-19 DIAGNOSIS — R5382 Chronic fatigue, unspecified: Secondary | ICD-10-CM | POA: Diagnosis not present

## 2024-05-19 DIAGNOSIS — L658 Other specified nonscarring hair loss: Secondary | ICD-10-CM | POA: Diagnosis not present

## 2024-05-19 DIAGNOSIS — R891 Abnormal level of hormones in specimens from other organs, systems and tissues: Secondary | ICD-10-CM | POA: Diagnosis not present

## 2024-05-20 ENCOUNTER — Encounter (HOSPITAL_COMMUNITY)
Admission: RE | Admit: 2024-05-20 | Discharge: 2024-05-20 | Disposition: A | Discharge status: Home / Self Care | Source: Ambulatory Visit | Attending: Gastroenterology | Admitting: Gastroenterology

## 2024-05-24 ENCOUNTER — Other Ambulatory Visit: Payer: Self-pay

## 2024-05-24 ENCOUNTER — Encounter (INDEPENDENT_AMBULATORY_CARE_PROVIDER_SITE_OTHER): Payer: Self-pay | Admitting: *Deleted

## 2024-05-24 ENCOUNTER — Encounter (HOSPITAL_COMMUNITY)
Admission: RE | Disposition: A | Discharge status: Home / Self Care | Payer: Self-pay | Source: Home / Self Care | Attending: Gastroenterology

## 2024-05-24 ENCOUNTER — Ambulatory Visit (HOSPITAL_COMMUNITY)
Admission: RE | Admit: 2024-05-24 | Discharge: 2024-05-24 | Disposition: A | Discharge status: Home / Self Care | Attending: Gastroenterology | Admitting: Gastroenterology

## 2024-05-24 ENCOUNTER — Encounter (HOSPITAL_COMMUNITY): Payer: Self-pay | Admitting: Gastroenterology

## 2024-05-24 ENCOUNTER — Ambulatory Visit (HOSPITAL_COMMUNITY): Admitting: Anesthesiology

## 2024-05-24 DIAGNOSIS — E785 Hyperlipidemia, unspecified: Secondary | ICD-10-CM | POA: Diagnosis not present

## 2024-05-24 DIAGNOSIS — Z800 Family history of malignant neoplasm of digestive organs: Secondary | ICD-10-CM

## 2024-05-24 DIAGNOSIS — D128 Benign neoplasm of rectum: Secondary | ICD-10-CM | POA: Diagnosis not present

## 2024-05-24 DIAGNOSIS — K644 Residual hemorrhoidal skin tags: Secondary | ICD-10-CM | POA: Insufficient documentation

## 2024-05-24 DIAGNOSIS — F419 Anxiety disorder, unspecified: Secondary | ICD-10-CM | POA: Diagnosis not present

## 2024-05-24 DIAGNOSIS — K219 Gastro-esophageal reflux disease without esophagitis: Secondary | ICD-10-CM | POA: Insufficient documentation

## 2024-05-24 DIAGNOSIS — K621 Rectal polyp: Secondary | ICD-10-CM

## 2024-05-24 DIAGNOSIS — Z1211 Encounter for screening for malignant neoplasm of colon: Secondary | ICD-10-CM | POA: Diagnosis not present

## 2024-05-24 LAB — HM COLONOSCOPY

## 2024-05-24 SURGERY — COLONOSCOPY
Anesthesia: General

## 2024-05-24 MED ORDER — LACTATED RINGERS IV SOLN
INTRAVENOUS | Status: DC | PRN
Start: 2024-05-24 — End: 2024-05-24

## 2024-05-24 MED ORDER — PROPOFOL 500 MG/50ML IV EMUL
INTRAVENOUS | Status: DC | PRN
  Administered 2024-05-24: 200 ug/kg/min via INTRAVENOUS

## 2024-05-24 MED ORDER — PROPOFOL 10 MG/ML IV BOLUS
INTRAVENOUS | Status: DC | PRN
  Administered 2024-05-24: 100 mg via INTRAVENOUS

## 2024-05-24 NOTE — Op Note (Signed)
 [facility] Patient Name: Alvin Howard Procedure Date: 05/24/2024 7:06 AM MRN: [id number] Date of Birth: [birth year] Attending MD: Samantha Cress , , [id number] CSN: [id number] Age: 58 Admit Type: Outpatient Procedure:                Colonoscopy Indications:              Screening in patient at increased risk: Family                            history of 1st-degree relative with colorectal                            cancer before age 57 years Providers:                Samantha Cress, Alisa App, [place] Wilson,                            Technician, Theola Fitch Referring MD:              Medicines:                Monitored Anesthesia Care Complications:            No immediate complications. Estimated Blood Loss:     Estimated blood loss: none. Procedure:                Pre-Anesthesia Assessment:                           - Prior to the procedure, a History and Physical                            was performed, and patient medications, allergies                            and sensitivities were reviewed. The patient's                            tolerance of previous anesthesia was reviewed.                           - The risks and benefits of the procedure and the                            sedation options and risks were discussed with the                            patient. All questions were answered and informed                            consent was obtained.                           - ASA Grade Assessment: II - A patient with mild                            systemic disease.  After obtaining informed consent, the colonoscope                            was passed under direct vision. Throughout the                            procedure, the patient's blood pressure, pulse, and                            oxygen saturations were monitored continuously. The                            [id number]-HQ190L ([id number]) scope was introduced through                             the anus and advanced to the the cecum, identified                            by appendiceal orifice and ileocecal valve. The                            colonoscopy was performed without difficulty. The                            patient tolerated the procedure well. The quality                            of the bowel preparation was adequate. Scope In: 7:48:22 AM Scope Out: 8:17:11 AM Scope Withdrawal Time: 0 hours 21 minutes 28 seconds  Total Procedure Duration: 0 hours 28 minutes 49 seconds  Findings:      Hemorrhoids were found on perianal exam.      A 3 mm polyp was found in the rectum. The polyp was sessile. The polyp       was removed with a cold snare. Resection and retrieval were complete.      The retroflexed view of the distal rectum and anal verge was normal and       showed no anal or rectal abnormalities. Impression:               - External hemorrhoids found on perianal exam.                           - One 3 mm polyp in the rectum, removed with a cold                            snare. Resected and retrieved.                           - The distal rectum and anal verge are normal on                            retroflexion view. Moderate Sedation:      Per Anesthesia Care Recommendation:           - Discharge patient to home (  ambulatory).                           - Resume previous diet.                           - Await pathology results.                           - Repeat colonoscopy in 5 years for screening                            purposes. Procedure Code(s):        --- Professional ---                           [phone], Colonoscopy, flexible; with removal of                            tumor(s), polyp(s), or other lesion(s) by snare                            technique Diagnosis Code(s):        --- Professional ---                           Z80.0, Family history of malignant neoplasm of                            digestive organs                            D12.8, Benign neoplasm of rectum CPT copyright 2022 American Medical Association. All rights reserved. The codes documented in this report are preliminary and upon coder review may  be revised to meet current compliance requirements. Samantha Cress, MD Samantha Cress,  05/24/2024 8:20:25 AM This report has been signed electronically. Number of Addenda: 0

## 2024-05-24 NOTE — Anesthesia Preprocedure Evaluation (Signed)
 Anesthesia Evaluation  Patient identified by MRN, date of birth, ID band Patient awake    Reviewed: Allergy & Precautions, H&P , NPO status , Patient's Chart, lab work & pertinent test results, reviewed documented beta blocker date and time   History of Anesthesia Complications (+) Family history of anesthesia reaction  Airway Mallampati: II  TM Distance: >3 FB Neck ROM: full    Dental no notable dental hx.    Pulmonary neg pulmonary ROS   Pulmonary exam normal breath sounds clear to auscultation       Cardiovascular Exercise Tolerance: Good hypertension, + Peripheral Vascular Disease  + dysrhythmias  Rhythm:regular Rate:Normal     Neuro/Psych  PSYCHIATRIC DISORDERS Anxiety     TIA   GI/Hepatic Neg liver ROS,GERD  ,,  Endo/Other  negative endocrine ROS    Renal/GU negative Renal ROS  negative genitourinary   Musculoskeletal   Abdominal   Peds  Hematology negative hematology ROS (+)   Anesthesia Other Findings   Reproductive/Obstetrics negative OB ROS                             Anesthesia Physical Anesthesia Plan  ASA: 3  Anesthesia Plan: General   Post-op Pain Management:    Induction:   PONV Risk Score and Plan: Propofol  infusion  Airway Management Planned:   Additional Equipment:   Intra-op Plan:   Post-operative Plan:   Informed Consent: I have reviewed the patients History and Physical, chart, labs and discussed the procedure including the risks, benefits and alternatives for the proposed anesthesia with the patient or authorized representative who has indicated his/her understanding and acceptance.     Dental Advisory Given  Plan Discussed with: CRNA  Anesthesia Plan Comments:        Anesthesia Quick Evaluation

## 2024-05-24 NOTE — Transfer of Care (Signed)
 Immediate Anesthesia Transfer of Care Note  Patient: Alvin Howard  Procedure(s) Performed: COLONOSCOPY  Patient Location: PACU  Anesthesia Type:General  Level of Consciousness: awake, drowsy, and patient cooperative  Airway & Oxygen Therapy: Patient Spontanous Breathing  Post-op Assessment: Report given to RN, Post -op Vital signs reviewed and stable, and Patient moving all extremities X 4  Post vital signs: Reviewed and stable  Last Vitals:  Vitals Value Taken Time  BP 93/64 0822  Temp 36.4 C 05/24/24 0820  Pulse 64 05/24/24 0820  Resp 18 05/24/24 0820  SpO2 97 % 05/24/24 0820    Last Pain:  Vitals:   05/24/24 0820  TempSrc: Oral  PainSc:          Complications: No notable events documented.

## 2024-05-24 NOTE — H&P (Signed)
 Alvin Howard is an 58 y.o. adult.   Chief Complaint: colorectal cancer screening HPI: 57 y/o M with PMH CA stenosis, anxiety, GERD, HLD, coming for family history of colon cancer.  His last colonoscopy was 10 years ago and it was normal.  The patient denies having any complaints such as melena, hematochezia, abdominal pain or distention, change in her bowel movement consistency or frequency, no changes in weight recently.  Mother was diagnosed with colon cancer when she was 72.   Past Medical History:  Diagnosis Date   Anxiety    Bilateral varicoceles    Carotid artery stenosis    Bilateral 1-39% ICA stenosis, antegrade vertebral flow.   Chigger    Currently   Dysrhythmia    rapid   Family history of anesthesia complication    Mother- N/V   Gallbladder polyp 01/25/2013   History of    GERD (gastroesophageal reflux disease)    resolved since gallbladder surgery   High cholesterol    Internal hemorrhoid    per colonoscopy   Left hydrocele 10/01/2015   Very large noted on US    Right hydrocele 10/01/2015   Small, noted on US    White coat syndrome without diagnosis of hypertension     Past Surgical History:  Procedure Laterality Date   CHOLECYSTECTOMY N/A 02/14/2013   Procedure: LAPAROSCOPIC CHOLECYSTECTOMY WITH INTRAOPERATIVE CHOLANGIOGRAM;  Surgeon: Levert Ready, MD;  Location: [facility] OR;  Service: General;  Laterality: N/A;   COLONOSCOPY     COLONOSCOPY WITH ESOPHAGOGASTRODUODENOSCOPY (EGD)  01/20/2013   Procedure: COLONOSCOPY WITH ESOPHAGOGASTRODUODENOSCOPY (EGD);  Surgeon: Ruby Corporal, MD;  Location: [facility];  Service: Endoscopy;  Laterality: N/A;  100   EXPLORATORY LAPAROTOMY     as a child   HYDROCELE EXCISION Left 09/07/2018   Procedure: HYDROCELECTOMY ADULT;  Surgeon: Homero Luster, MD;  Location: [facility];  Service: Urology;  Laterality: Left;   NM MYOVIEW  LTD  09/01/2012   normal    Family History  Problem Relation Age of Onset   Colon  cancer Mother    Cancer Mother        colon, ovarian   Cancer Father        lung   Social History:  reports that she has never smoked. She has never used smokeless tobacco. She reports current alcohol use. She reports that she does not use drugs.  Allergies: No Known Allergies  Medications Prior to Admission  Medication Sig Dispense Refill   losartan (COZAAR) 50 MG tablet Take 50 mg by mouth daily.     aspirin  81 MG chewable tablet Chew 81 mg by mouth daily. (Patient not taking: Reported on 03/09/2024)     fish oil-omega-3 fatty acids 1000 MG capsule Take 2 g by mouth daily.      HYDROcodone -acetaminophen  (NORCO) 5-325 MG tablet Take 1 tablet by mouth every 6 (six) hours as needed for moderate pain. (Patient not taking: Reported on 03/09/2024) 8 tablet 0   Nutritional Supplements (JUICE PLUS FIBRE PO) Take by mouth.     UNABLE TO FIND Med Name: Magnesium Malate 1350 mg; Juice Plus      No results found for this or any previous visit (from the past 48 hours). No results found.  Review of Systems  All other systems reviewed and are negative.   Blood pressure (!) 153/76, pulse 67, temperature 98.2 F (36.8 C), temperature source Oral, resp. rate 18, height 5\' 7"  (1.702 m), weight 86.2 kg, SpO2 100%.  Physical Exam  GENERAL: The patient is AO x3, in no acute distress. HEENT: Head is normocephalic and atraumatic. EOMI are intact. Mouth is well hydrated and without lesions. NECK: Supple. No masses LUNGS: Clear to auscultation. No presence of rhonchi/wheezing/rales. Adequate chest expansion HEART: RRR, normal s1 and s2. ABDOMEN: Soft, nontender, no guarding, no peritoneal signs, and nondistended. BS +. No masses. EXTREMITIES: Without any cyanosis, clubbing, rash, lesions or edema. NEUROLOGIC: AOx3, no focal motor deficit. SKIN: no jaundice, no rashes  Assessment/Plan 58 y/o M with PMH CA stenosis, anxiety, GERD, HLD, coming for family history of colon cancer.  Will proceed with  colonoscopy.  Urban Garden, MD 05/24/2024, 7:35 AM

## 2024-05-24 NOTE — Discharge Instructions (Signed)
You are being discharged to home.  Resume your previous diet.  We are waiting for your pathology results.  Your physician has recommended a repeat colonoscopy in five years for screening purposes.  

## 2024-05-25 ENCOUNTER — Encounter (HOSPITAL_COMMUNITY): Payer: Self-pay | Admitting: Gastroenterology

## 2024-05-25 ENCOUNTER — Ambulatory Visit (INDEPENDENT_AMBULATORY_CARE_PROVIDER_SITE_OTHER): Payer: Self-pay | Admitting: Gastroenterology

## 2024-05-25 LAB — SURGICAL PATHOLOGY

## 2024-05-26 NOTE — Anesthesia Postprocedure Evaluation (Signed)
 Anesthesia Post Note  Patient: Alvin Howard  Procedure(s) Performed: COLONOSCOPY  Patient location during evaluation: Phase II Anesthesia Type: General Level of consciousness: awake Pain management: pain level controlled Vital Signs Assessment: post-procedure vital signs reviewed and stable Respiratory status: spontaneous breathing and respiratory function stable Cardiovascular status: blood pressure returned to baseline and stable Postop Assessment: no headache and no apparent nausea or vomiting Anesthetic complications: no Comments: Late entry   No notable events documented.   Last Vitals:  Vitals:   05/24/24 0820 05/24/24 0829  BP:  (!) 100/52  Pulse: 64   Resp: 18   Temp: (!) 36.4 C   SpO2: 97%     Last Pain:  Vitals:   05/25/24 1505  TempSrc:   PainSc: 0-No pain                 Coretha Dew

## 2024-07-11 DIAGNOSIS — H00021 Hordeolum internum right upper eyelid: Secondary | ICD-10-CM | POA: Diagnosis not present

## 2024-07-25 DIAGNOSIS — L570 Actinic keratosis: Secondary | ICD-10-CM | POA: Diagnosis not present

## 2024-07-25 DIAGNOSIS — L820 Inflamed seborrheic keratosis: Secondary | ICD-10-CM | POA: Diagnosis not present

## 2024-09-19 DIAGNOSIS — L918 Other hypertrophic disorders of the skin: Secondary | ICD-10-CM | POA: Diagnosis not present

## 2024-09-19 DIAGNOSIS — L570 Actinic keratosis: Secondary | ICD-10-CM | POA: Diagnosis not present

## 2024-10-05 ENCOUNTER — Encounter (INDEPENDENT_AMBULATORY_CARE_PROVIDER_SITE_OTHER): Payer: Self-pay | Admitting: Gastroenterology

## 2024-11-10 DIAGNOSIS — H00022 Hordeolum internum right lower eyelid: Secondary | ICD-10-CM | POA: Diagnosis not present

## 2024-11-10 DIAGNOSIS — Z6830 Body mass index (BMI) 30.0-30.9, adult: Secondary | ICD-10-CM | POA: Diagnosis not present

## 2024-11-10 DIAGNOSIS — I10 Essential (primary) hypertension: Secondary | ICD-10-CM | POA: Diagnosis not present

## 2024-11-10 DIAGNOSIS — E7849 Other hyperlipidemia: Secondary | ICD-10-CM | POA: Diagnosis not present
